# Patient Record
Sex: Female | Born: 1990 | Race: White | Hispanic: No | Marital: Single | Smoking: Former smoker
Health system: Southern US, Community
[De-identification: ages and names within clinical notes are randomized; demographics above are authoritative.]

## PROBLEM LIST (undated history)

## (undated) DIAGNOSIS — R569 Unspecified convulsions: Secondary | ICD-10-CM

## (undated) DIAGNOSIS — M797 Fibromyalgia: Secondary | ICD-10-CM

## (undated) DIAGNOSIS — F329 Major depressive disorder, single episode, unspecified: Secondary | ICD-10-CM

## (undated) DIAGNOSIS — F32A Depression, unspecified: Secondary | ICD-10-CM

## (undated) DIAGNOSIS — G629 Polyneuropathy, unspecified: Secondary | ICD-10-CM

## (undated) DIAGNOSIS — F419 Anxiety disorder, unspecified: Secondary | ICD-10-CM

## (undated) DIAGNOSIS — G8929 Other chronic pain: Secondary | ICD-10-CM

## (undated) DIAGNOSIS — G44321 Chronic post-traumatic headache, intractable: Secondary | ICD-10-CM

## (undated) DIAGNOSIS — F509 Eating disorder, unspecified: Secondary | ICD-10-CM

## (undated) DIAGNOSIS — R630 Anorexia: Secondary | ICD-10-CM

## (undated) DIAGNOSIS — J45909 Unspecified asthma, uncomplicated: Secondary | ICD-10-CM

## (undated) DIAGNOSIS — K9041 Non-celiac gluten sensitivity: Secondary | ICD-10-CM

## (undated) DIAGNOSIS — M1990 Unspecified osteoarthritis, unspecified site: Secondary | ICD-10-CM

## (undated) DIAGNOSIS — F5000 Anorexia nervosa, unspecified: Secondary | ICD-10-CM

## (undated) HISTORY — PX: TYMPANOSTOMY TUBE PLACEMENT: SHX32

## (undated) HISTORY — DX: Chronic post-traumatic headache, intractable: G44.321

## (undated) HISTORY — DX: Unspecified osteoarthritis, unspecified site: M19.90

## (undated) HISTORY — DX: Polyneuropathy, unspecified: G62.9

## (undated) HISTORY — DX: Anorexia nervosa, unspecified: F50.00

## (undated) HISTORY — PX: OTHER SURGICAL HISTORY: SHX169

## (undated) HISTORY — PX: ELECTROCONVULSIVE THERAPY: SHX1495

---
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
## 2010-04-28 LAB — GC/CHLAMYDIA PROBE AMP, URINE: Chlamydia, Swab/Urine, PCR: NEGATIVE

## 2010-04-28 LAB — DRUGS OF ABUSE SCREEN W/O ALC, ROUTINE URINE
Amphetamine Screen, Ur: NEGATIVE
Barbiturate Quant, Ur: NEGATIVE
Benzodiazepines.: NEGATIVE
Cocaine Metabolites: NEGATIVE
Creatinine,U: 160.4 mg/dL
Marijuana Metabolite: NEGATIVE
Methadone: NEGATIVE
Opiate Screen, Urine: NEGATIVE
Phencyclidine (PCP): NEGATIVE
Propoxyphene: NEGATIVE

## 2010-04-28 LAB — HEPATIC FUNCTION PANEL
ALT: 34 U/L (ref 0–35)
AST: 27 U/L (ref 0–37)
Albumin: 4 g/dL (ref 3.5–5.2)
Alkaline Phosphatase: 61 U/L (ref 39–117)
Bilirubin, Direct: <0.1 mg/dL (ref 0.0–0.3)
Total Bilirubin: 0.4 mg/dL (ref 0.3–1.2)
Total Protein: 7.1 g/dL (ref 6.0–8.3)

## 2010-04-28 LAB — URINALYSIS, ROUTINE W REFLEX MICROSCOPIC
Glucose, UA: NEGATIVE mg/dL
Nitrite: NEGATIVE
Nitrite: NEGATIVE
Protein, ur: NEGATIVE mg/dL
Specific Gravity, Urine: 1.025 (ref 1.005–1.030)
Urobilinogen, UA: 0.2 mg/dL (ref 0.0–1.0)
pH: 6 (ref 5.0–8.0)

## 2010-04-28 LAB — CBC
HCT: 37.3 % (ref 36.0–46.0)
HCT: 37.4 % (ref 36.0–46.0)
Hemoglobin: 12.5 g/dL (ref 12.0–15.0)
MCV: 95.1 fL (ref 78.0–100.0)
MCV: 96.6 fL (ref 78.0–100.0)
RBC: 3.86 MIL/uL — ABNORMAL LOW (ref 3.87–5.11)
RBC: 3.93 MIL/uL (ref 3.87–5.11)
WBC: 5.8 10*3/uL (ref 4.0–10.5)
WBC: 9.1 10*3/uL (ref 4.0–10.5)

## 2010-04-28 LAB — COMPREHENSIVE METABOLIC PANEL
Alkaline Phosphatase: 46 U/L (ref 39–117)
BUN: 11 mg/dL (ref 6–23)
CO2: 31 mEq/L (ref 19–32)
Chloride: 107 mEq/L (ref 96–112)
Creatinine, Ser: 0.62 mg/dL (ref 0.4–1.2)
GFR calc non Af Amer: >60 mL/min (ref >60)
Potassium: 3.7 mEq/L (ref 3.5–5.1)
Total Bilirubin: 0.5 mg/dL (ref 0.3–1.2)

## 2010-04-28 LAB — DIFFERENTIAL
Basophils Absolute: 0 10*3/uL (ref 0.0–0.1)
Basophils Absolute: 0.1 10*3/uL (ref 0.0–0.1)
Basophils Relative: 0 % (ref 0–1)
Basophils Relative: 1 % (ref 0–1)
Eosinophils Absolute: 0.2 10*3/uL (ref 0.0–0.7)
Eosinophils Relative: 2 % (ref 0–5)
Eosinophils Relative: 2 % (ref 0–5)
Lymphocytes Relative: 28 % (ref 12–46)
Lymphocytes Relative: 42 % (ref 12–46)
Lymphs Abs: 2.5 10*3/uL (ref 0.7–4.0)
Monocytes Absolute: 0.4 10*3/uL (ref 0.1–1.0)
Monocytes Absolute: 0.7 10*3/uL (ref 0.1–1.0)
Monocytes Relative: 8 % (ref 3–12)
Neutro Abs: 2.8 10*3/uL (ref 1.7–7.7)
Neutro Abs: 5.6 10*3/uL (ref 1.7–7.7)
Neutrophils Relative %: 62 % (ref 43–77)

## 2010-04-28 LAB — GAMMA GT: GGT: 26 U/L (ref 7–51)

## 2010-04-28 LAB — BASIC METABOLIC PANEL
BUN: 10 mg/dL (ref 6–23)
CO2: 27 mEq/L (ref 19–32)
Calcium: 9.3 mg/dL (ref 8.4–10.5)
Chloride: 101 mEq/L (ref 96–112)
Creatinine, Ser: 0.66 mg/dL (ref 0.4–1.2)
GFR calc Af Amer: >60 mL/min (ref >60)
GFR calc non Af Amer: >60 mL/min (ref >60)
Glucose, Bld: 95 mg/dL (ref 70–99)
Potassium: 4.2 mEq/L (ref 3.5–5.1)
Sodium: 140 mEq/L (ref 135–145)

## 2010-04-28 LAB — RPR: RPR Ser Ql: NONREACTIVE

## 2010-04-28 LAB — ETHANOL: Alcohol, Ethyl (B): <5 mg/dL (ref 0–10)

## 2010-04-28 LAB — TSH: TSH: 3.532 u[IU]/mL (ref 0.350–4.500)

## 2010-04-28 LAB — PREGNANCY, URINE: Preg Test, Ur: NEGATIVE

## 2010-04-28 LAB — RAPID URINE DRUG SCREEN, HOSP PERFORMED: Barbiturates: NOT DETECTED

## 2010-04-28 LAB — POCT PREGNANCY, URINE: Preg Test, Ur: NEGATIVE

## 2010-05-02 LAB — CBC
MCHC: 34.5 g/dL (ref 31.0–37.0)
MCV: 93.9 fL (ref 78.0–98.0)
MCV: 93.9 fL (ref 78.0–98.0)
Platelets: 200 10*3/uL (ref 150–400)
RBC: 3.7 MIL/uL — ABNORMAL LOW (ref 3.80–5.70)
RBC: 3.8 MIL/uL (ref 3.80–5.70)
WBC: 5.3 10*3/uL (ref 4.5–13.5)
WBC: 5.6 10*3/uL (ref 4.5–13.5)

## 2010-05-02 LAB — RAPID URINE DRUG SCREEN, HOSP PERFORMED
Barbiturates: NOT DETECTED
Cocaine: NOT DETECTED
Opiates: NOT DETECTED

## 2010-05-02 LAB — DIFFERENTIAL
Basophils Absolute: 0 10*3/uL (ref 0.0–0.1)
Basophils Relative: 1 % (ref 0–1)
Basophils Relative: 1 % (ref 0–1)
Eosinophils Absolute: 0.1 10*3/uL (ref 0.0–1.2)
Eosinophils Absolute: 0.1 10*3/uL (ref 0.0–1.2)
Eosinophils Relative: 2 % (ref 0–5)
Lymphocytes Relative: 47 % (ref 24–48)
Monocytes Relative: 9 % (ref 3–11)
Neutro Abs: 2.4 10*3/uL (ref 1.7–8.0)
Neutrophils Relative %: 44 % (ref 43–71)

## 2010-05-02 LAB — COMPREHENSIVE METABOLIC PANEL
ALT: 50 U/L — ABNORMAL HIGH (ref 0–35)
AST: 66 U/L — ABNORMAL HIGH (ref 0–37)
Alkaline Phosphatase: 43 U/L — ABNORMAL LOW (ref 47–119)
CO2: 29 mEq/L (ref 19–32)
Chloride: 105 mEq/L (ref 96–112)
Creatinine, Ser: 0.62 mg/dL (ref 0.4–1.2)
Total Bilirubin: 0.5 mg/dL (ref 0.3–1.2)

## 2010-05-02 LAB — HEPATIC FUNCTION PANEL
ALT: 59 U/L — ABNORMAL HIGH (ref 0–35)
AST: 20 U/L (ref 0–37)
Albumin: 3.6 g/dL (ref 3.5–5.2)
Alkaline Phosphatase: 38 U/L — ABNORMAL LOW (ref 39–117)
Bilirubin, Direct: 0.1 mg/dL (ref 0.0–0.3)
Indirect Bilirubin: 0.4 mg/dL (ref 0.3–0.9)
Total Bilirubin: 0.5 mg/dL (ref 0.3–1.2)

## 2010-05-02 LAB — HCG, SERUM, QUALITATIVE: Preg, Serum: NEGATIVE

## 2010-05-02 LAB — URINALYSIS, DIPSTICK ONLY
Bilirubin Urine: NEGATIVE
Hgb urine dipstick: NEGATIVE
Ketones, ur: NEGATIVE mg/dL
Nitrite: NEGATIVE
Urobilinogen, UA: 1 mg/dL (ref 0.0–1.0)

## 2010-05-02 LAB — DRUGS OF ABUSE SCREEN W/O ALC, ROUTINE URINE
Amphetamine Screen, Ur: NEGATIVE
Barbiturate Quant, Ur: NEGATIVE
Cocaine Metabolites: NEGATIVE
Methadone: NEGATIVE
Phencyclidine (PCP): NEGATIVE

## 2010-05-02 LAB — ACETAMINOPHEN LEVEL: Acetaminophen (Tylenol), Serum: <10 ug/mL — ABNORMAL LOW (ref 10–30)

## 2010-05-02 LAB — PHOSPHORUS: Phosphorus: 3.3 mg/dL (ref 2.3–4.6)

## 2010-05-02 LAB — LIPID PANEL
Cholesterol: 195 mg/dL — ABNORMAL HIGH (ref 0–169)
Total CHOL/HDL Ratio: 3 RATIO

## 2010-05-02 LAB — POCT I-STAT, CHEM 8
BUN: 12 mg/dL (ref 6–23)
Calcium, Ion: 1.15 mmol/L (ref 1.12–1.32)
Chloride: 106 mEq/L (ref 96–112)
Creatinine, Ser: 0.8 mg/dL (ref 0.4–1.2)
Glucose, Bld: 80 mg/dL (ref 70–99)
HCT: 35 % — ABNORMAL LOW (ref 36.0–49.0)
Potassium: 3.8 mEq/L (ref 3.5–5.1)

## 2010-05-02 LAB — MAGNESIUM: Magnesium: 1.8 mg/dL (ref 1.5–2.5)

## 2010-05-02 LAB — BENZODIAZEPINE, QUANTITATIVE, URINE: Alprazolam (GC/LC/MS), ur confirm: NEGATIVE

## 2010-06-04 NOTE — H&P (Signed)
NAME:  Terry Schneider, NATIONS NO.:  [id number]   MEDICAL RECORD NO.:  [id number]          PATIENT TYPE:  INP   LOCATION:  0604                          FACILITY:  [facility]   PHYSICIAN:  Lalla Brothers, MDDATE OF BIRTH:  01-21-1990   DATE OF ADMISSION:  04/10/2008  DATE OF DISCHARGE:                       PSYCHIATRIC ADMISSION ASSESSMENT   IDENTIFICATION:  A 20-year-5-month-old female is admitted emergently  voluntarily upon transfer from [facility] Emergency Department  for inpatient stabilization and treatment of suicide risk, voices which  mother denies telling the patient to eat because she is going to die  anyway, frustration and regressive fixation, and loss of control of  eating disorder symptoms of 6 months.  The patient fears but expects to  be held down and forced and she extorts avoidance of more responsible  treatment for her problems by her retaliatory voices and threats.  She  reports 2 previous suicide gestures and now 6 weeks of suicidal ideation  escalated over the last 4 to 5 days to a plan to jump into traffic or  overdose.  The emergency department could not secure eating disorder  treatment primarily with all concluding emphasis to be stabilization of  depressive symptoms and suicide risk first.  As the patient was entering  the acute inpatient adolescent unit, mother was expecting to bring the  patient food in place of hospital food, with the patient having last  eaten a kid's [place] meal at [employer] the night before and sometimes going  a couple of days without eating.   HISTORY OF PRESENT ILLNESS:  The patient is currently morbidly fixated  and frustration and despair of her eating disorder symptoms she  concludes to be incurable.  The patient tends to convince other family  members of the same.  The patient is in a developmental vulnerable  position being the youngest child in the family after alcoholic father  divorced 2 years ago.   Father rarely sees the patient despite living 5  minutes away.  Mother expects that the patient is sophisticated and high  achieving so as the mother does not agree the patient could be regressed  with baby talk and swinging for an hour a day as one of her principal  activities.  The patient is declining in her school performance barely  passing math and other grades down from A's and B's.  The patient is  taking Prozac 20 mg daily though mother reports they are tapering the  medication for discontinuation as it is not helping.  The patient has  not benefited from a previous trial of Lexapro prior to Prozac.  The  patient does receive Xanax 0.5 mg which apparently the mother governs.  She had Valium as 2 doses of 10 mg each orally in the emergency  department for her agitation.  She was considered by ACT team counselor  to have auditory hallucinations and psychosis though apparently mother  and emergency department staff did not agree.  The patient has also  taken some Zofran as needed apparently for nausea, purging.  Electrolytes were normal in the emergency department.  Total protein was  5.9 with lower limit of normal 6.  Hematocrit was 34.7 with lower limit  of normal 36.  The patient reported a 20- to 30-pound weight loss since  November 2009 with a 30-pound weight loss calculated for changes in  current body weight to represent a 23% loss of body weight over 3 to 4  months with ideal body weight to be addressed.  The patient reports  amenorrhea since approximately November 2009 approximately 4 months.  She has body image distortion.  She purges several times daily.  She  denies any sexual activity.  Her urine drug screen was positive for  benzodiazepines, apparently the Valium or Xanax.   REVIEW OF SYSTEMS:  The patient has been under the psychotherapeutic  care of Shanon Rosser at [phone].  She has had no known seizure or  syncope.  She has had no heart murmur or arrhythmia.  The  patient denies  difficulty with gait, gaze or continence.  She denies exposure to  communicable disease or toxins.  She denies rash, jaundice or purpura.  There is no chest pain, palpitations or presyncope.  There is no  abdominal pain except for a feeling of fullness, diarrhea, dysuria or  arthralgia.  There is no headache or memory loss.  There is no sensory  loss or coordination deficit.   Immunizations are up to date.   FAMILY HISTORY:  The patient resides with mother and 20 year old sister  who is away at college.  Parents separated 3 years ago and divorced 2  years ago with the patient last seeing father January 11, 2008.  She  has little contact with father despite him being 5 minutes away.  The  patient perceives that father prefers the children of his girlfriend.  Father and paternal grandfather had substance abuse with alcohol.  Paternal grandmother had depression.  Maternal great-aunt had bipolar  disorder treated with lithium.  Maternal great-grandmother required ECT  and a year-long psychiatric institutionalization.  Father was  emotionally abusive from drinking in the past.  Paternal uncle had a  heart attack and died in Jun 22, 2005.  Another paternal uncle had suicide  attempt and overdose in his 20s.   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  She has A and B  grades dropping over the last month including math barely passing.  She  works at [employer] the last year and associates often with  management.  The patient indicates she is accepted at [employer] in [place], [address] or [employer].  However, she has not decided on school.  It appears that current illness  will interfere with even high school completion.   ASSETS:  The patient is intelligent.   MENTAL STATUS EXAM:  Height is 161.5 cm and weight is 49.5 kg.  Blood  pressure is 117/80  with heart rate of 80 sitting and 115/78 with heart  rate of 91 standing.  Temperature is 98.2.  She is right handed.  She is  alert and oriented with cranial nerves intact though speech is somewhat  regressive and tongue thrustingly slow and imprecise.  She has normal  muscle strength and tone.  There are no pathologic reflexes or soft  neurologic findings.  There are no abnormal involuntary movements.  Gait  and gaze are intact.  The patient has regressive childlike voice and  manners.  Her symptoms exacerbate as others attempt to  coerce her.  She  incorporates defeat for each element of treatment to prove that she is  incurable.  She is working the last year at [employer].  She presents  severe dysphoria by history though she is progressively laughing on  arrival.  She is not anxious but is rigid despite appearing  disinhibited.  She has had death and failure fixations.  She reports  voices telling her to go ahead and eat because she is dying anyway.  She  has no homicidal ideation.  She reports 2 previous suicide gestures and  now a plan to jump in traffic or overdose.   IMPRESSION:  Axis I:  1. Major depression, single episode, severe.  2. Anorexia nervosa, purging type.  3. Anxiety disorder, not otherwise specified (provisional diagnosis).  4. Parent child problem.  5. Other specified family circumstances.  6. Other interpersonal problem.  Axis II:  Diagnosis deferred.  Axis III:  Reported weight loss of 20 to 30 pounds in 4 months.  Axis IV:  Stressors, family, severe acute and chronic; phase of life,  extreme acute and chronic; school, mild acute and acute and chronic;  occupational, moderate acute and chronic.  Axis V:  Global Assessment Functioning on admission 20 with highest in  last year 78.   PLAN:  The patient is admitted for inpatient adolescent psychiatric and  multidisciplinary multimodal behavioral treatment in a team-based  programmatic locked psychiatric unit.   Will start Remeron  pharmacotherapy at 15 mg nightly and can discontinue Prozac.  Nutrition  consultation is planned along with multivitamin.  Cognitive behavioral  therapy, anger management, interpersonal therapy, habit reversal, family  therapy, individuation separation, social and communication skill  training and problem-solving and coping skill training can be undertaken  as stabilization is accomplished of depression and suicidality in order  to secure transfer to the [facility] for the patient's eating  disorder treatment.  Mother is educated on the medications including FDA  warnings and side effects.  Estimated length of stay is 5 to 7 days with target symptoms for  discharge being stabilization of suicide risk and mood, stabilization of  dangerous disruptive behavior and generalization of the capacity for  safe and effective participation in next level of care.      Lalla Brothers, MD  Electronically Signed     GEJ/MEDQ  D:  04/11/2008  T:  04/11/2008  Job:  [id number]

## 2010-06-04 NOTE — Discharge Summary (Signed)
NAME:  Terry Schneider, Terry Schneider NO.:  [id number]   MEDICAL RECORD NO.:  [id number]          PATIENT TYPE:  INP   LOCATION:  0604                          FACILITY:  [facility]   PHYSICIAN:  Lalla Brothers, MDDATE OF BIRTH:  [birth year]   DATE OF ADMISSION:  04/10/2008  DATE OF DISCHARGE:  04/14/2008                               DISCHARGE SUMMARY   IDENTIFICATION:  Immediately 20 year old female, 12th grade student at  [employer] was admitted emergently, voluntarily upon transfer  from [facility] Emergency Department for inpatient psychiatric  treatment of suicide risk, auditory illusions or hallucinations,  regressive interpersonal fixation extending to activities, and loss of  control of eating disorder symptoms of 6 months.  Patient has ruminative  and obsessive conflicts about the origin and management of anorexic  symptoms with purging.  Mother appears driven that the patient will  succeed and graduate from high school and attend college while the  patient almost seems to identify with father in disengaging from  responsibilities.  The patient has a suicide plan to jump into traffic  or overdose, reporting 2 previous gestures.  Mother seeks stabilization  of the patient's mood and dangerous behavior, anticipating that the  patient will enter eating disorder center in [place] next week once  she is more psychologically stable from her current threats and  overwhelming psychological and behavioral symptoms outside of eating  disorder.  For full details please see the typed admission assessment.   SYNOPSIS OF PRESENT ILLNESS:  The patient is the youngest of 2 children,  having a 68 year old sister away at college.  Biological parents  divorced 2 years ago after 3 years of separation.  The patient last saw  father January 11, 2008, though father lives 5 minutes away.  The  patient will not talk to older sister about eating problems.  The father  has  substance abuse with alcohol and has been emotionally maltreating  the family.  Paternal uncle died of myocardial infarction in 2007.  Father speaks mainly to others about his girlfriend's children.  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  A's and B's are now dropping, especially math in the last  month.  Her cholesterol as been elevated in the past.  Paternal uncle  attempted suicide in his 36s with cleaning fluid.  Maternal great-aunt  was treated with lithium for bipolar disorder.  Paternal grandmother had  depression.  Maternal great-grandmother was institutionalized for 1 year  having ECT.  Father and paternal grandfather have substance abuse with  alcohol.  The patient has been working effectively with her  psychotherapist, Romeo Apple at [phone].  The patient has been  treated with Lexapro and then Prozac from Dr. Jennette Kettle, with Prozac now  being tapered because it has been unsuccessful.  She had Xanax 0.5 mg  which mother administers p.r.n. for the patient, though the emergency  department give the patient Valium 20 mg for agitation.  The patient has  had 4 months of  amenorrhea with last menses in November 2009.  She  reports approximately a 20-30 pound weight loss or more since November  2009 which would be a maximum of 23% of total body weight being lost.  The patient has body image distortion.  She is now purging after meals  and has tremendous psychic turmoil and conflict over whether or not to  eat.  She reports that she hears a derogatory voice telling her to go  ahead and eat because she is going to die anyway.  She becomes suicidal  each time she eats, last eating a kids [place] meal at [employer] the night  before admission.  She swings at least an hour a day on her swing set at  home, easily leaning backward in the seat if she is angry and leaning  forward and she is  regressed.   INITIAL MENTAL STATUS EXAM:  The patient is right handed with intact  neurological exam.  She has a regressive childlike voice on arrival with  similar mannerisms, though she did receive 20 mg of Valium in the  emergency department.  She talks with a tongue thrust in a slow  imprecise fashion.  She incorporates defeat cognitively for each  mechanism of treatment here or elsewhere so that she is now fixated and  severe dysphoria and treatment failure.  She thereby has 2 suicide  gestures in the past with a current plan to jump in traffic or overdose.  She is not floridly psychotic or dissociative, though she is  significantly regressed and depressed.   LABORATORY FINDINGS:  In the emergency department, potassium was 3.8,  dropping to 3.4 with hydration and initial efforts at refeeding  including accomplishing oral hydration.  Her total CO2 was 31 when  potassium was 3.8.  CO2 of 29, potassium 3.4.  Hemoglobin was 11.9 in  the emergency department, repeated at 12:00 with red count slightly low  at 3.7 million with lower limit of normal 3.8 million.  CBC otherwise  normal.  Repeat CBC on her Remeron 3 days later noted hemoglobin normal  at 12 with hematocrit slightly low at 35.7 with lower limit of normal  36.  White count was normal at 5300, down from 5600 3days earlier in the  emergency department.  MCV was 93.9, platelet count 254,000, both  normal.  Blood alcohol and urine drug screens were negative except  positive for benzodiazepines, likely the Xanax at home or Valium in the  emergency department.  Urine pregnancy and serum pregnancy tests were  negative.  Acetaminophen and salicylate blood levels were negative.  In  the emergency department, AST was 20 with reference range 0-37, becoming  66 3 days later at the [facility] while on Remeron 30 mg  nightly and undergoing some degree of brief eating.  On the day of  discharge, hepatic function panel noted AST  down to 64 from 66 with  reference range 0-37.  Initial ALT was 15 with normal 0-35 in the  emergency department, becoming 50 3 days later at the [facility] on Remeron and with refeeding efforts and the following day on  the day of discharge was 59 with reference range 0-35.  Serial albumins  were 3.6, 3.8, and 3.6 respectively with normal being 3.5-5.2.  Total  protein varied from 5.9 in the emergency department to 6.1 3 days later  at the [facility] and then 5.9 the following day on the  day of discharge with reference  range 3.5-5.2.  GGT was normal at 20  with reference range 7-51.  Ten-hour fasting lipid profile revealed  total cholesterol slightly elevated at 195 with reference range 0-169  with LDL 115 with upper limit of normal 109 mg/dL.  HDL cholesterol was  normal at 65, VLDL 15, and triglycerides 77 mg/dL.  TSH was normal at  1.884 with reference range 0.35-4.5.  Phosphorus was normal at 3.3 with  reference range 2.3-4.6.  Magnesium was 1.8 with reference range 1.5-  2.5.  Urinalysis was normal with specific gravity of 1.010 and pH 6.5.  Electrocardiogram was sinus bradycardia with rate of 59 with some sinus  arrhythmia, otherwise normal EKG with QRS of 86, QTC of 427 milliseconds  and PR of 136 milliseconds.  There were no contraindications to  definitive treatment with Remeron or for eating disorder.   HOSPITAL COURSE AND TREATMENT:  General medical exam by Jorje Guild, [facility]  noted mild or borderline anemia findings, likely that of chronic disease  associated with her weight loss over 6 months.  Although further workup  of anemia certainly possible with the patient's emaciated state, it  appears that treatment of the anorexia is foremost significance.  Exam  was otherwise generally unremarkable except for her undernourished,  somewhat emaciated state with amenorrhea since November 2009 and likely  a 23% weight loss.  The patient was seen by  nutrition, Kendell Bane,  RD, LDN, though the patient was not appreciative of the help provided.  Still she did begin to respond more favorably in her behavioral therapy  after the consultation April 11, 2008.  Estimated caloric needs were 40  kilocalories per kilogram and protein of 0.8 g per kilogram.  Her BMI  was 18.7 with her weight loss estimated over 4 months at 22%.  The  patient was offered resource in addition to her to chewable  multivitamin, multimineral, and a regular diet.  Mother was not allowed  to bring food to the hospital unit.  The patient was started on Remeron  15 mg the first night, titrated up to 30 mg the second night, as she had  not been sleeping well and had depression and anxiety contributing to  her desperate sense that she was hearing a voice saying that she would  be held down and force fed.  The patient reported increased suicidal  ideation for the first few days as she did begin to eat more.  She ate a  normal size meal prior to discharge.  She had a 30-minute watch after  meals.  Her vital signs improved through hospital stay.  Her height was  161.5 cm.  Her admission weight was 49.5 kg, dropping to 49.25 kg, but  on the day of discharge was 52 kg.  Temperature was 98.2 on admission,  dropping to a low of 97.3, and was 97.7 on the day of discharge.  Initial supine blood pressure was 105/72 with heart rate of 49 and  standing blood pressure 121/84 with heart rate of 67.  On the day of  discharge, supine blood pressure was 107/75 with heart rate of 83 and  standing blood pressure 146/73 with heart rate of 109.  Father wanted  the patient's medical analysis on the unit when the patient was asking  not to see father on the unit as she was not psychologically able to  address the family problems including their absence of contact for at  least 3 months.  Father's call was returned at [phone],  Haliyah Fryman just after the patient's discharge when  medical testing results  were available.  A message was left as their was no answer about the  availability of results now.  Mother can be reached at [phone]. Amy  Mccurley.  Mother initially demanded expedited discharge so the patient  could be at home with pets, swinging, and preparing for next  responsibilities, seemingly meaning [facility] in [place].  Mother  gradually relaxed and allowed the patient to complete initial acute  inpatient treatment for depression.  Mother and patient improved  communication.  The patient received an influenza vaccine prior to  discharge and was celebrating her birthday on the day of discharge  including with peers.  Where she initially laid in bed and declined to  participate in the program, she by the time of discharge was active in  all parts of treatment though she was somewhat limited in recreation  therapy though she was being allowed to attend.  Her mood, physical  stamina and endurance, and treatment participation did improve.  By the  time of discharge she was having no hallucinations or illusions of an  auditory nature.  Her suicidal ideation remitted including in the final  family therapy session.  Initially mother expected a 5-day hiatus before  she could enter an eating disorder program but she found that that had  been shortened to a 4-day hiatus after they received all of the faxed  data on the patient.  The patient's slight elevation in liver function  test was felt to be associated with both the introduction of Remeron and  the refeeding.  The mild elevation appears to have peaked by the time of  discharge with AST starting back down.  The patient is not considered to  have any medical impairment other than that of her chronic malnutrition.  Mother and the patient processed that the patient's worst time for  feeling suicidal was from 3:00 a.m. to 4:00 a.m., and although the  patient is free of suicidal ideation at the time of  discharge, mother  plans to sleep in the patient's room over the first several days.  The  patient is hydrating much better by the time of discharge and was  feeding 3 meals a day as well.  She required no seclusion or restraint  during hospital stay.   FINAL DIAGNOSES:  AXIS I:  1. Major depression, single episode, severe  2. Anorexia nervosa, purging type.  3. Parent child problem.  4. Other specified family circumstances.  5. Other interpersonal problems.  AXIS II:  Diagnosis deferred.  AXIS III:  1. Reported weight loss of 22% in 4 months with amenorrhea.  2. Minimal hypokalemia.  3. Minimal elevation of liver function tests likely from refeeding and      Remeron, beginning to resolve by the time of discharge.  4. Borderline nutritional anemia and low total protein.  AXIS IV:  Stressors:  Family severe acute and chronic; phase of life  extreme acute and chronic; school mild acute and chronic; occupational  moderate acute and chronic.  AXIS V: GAF on admission 20 with highest in last year 78 and discharge  GAF was 43.   PLAN:  The patient and mother did complete the inpatient treatment as  per goals and plans.  The patient is discharged on the weight  maintenance diet as per nutritionist April 11, 2008.  She will increase  activity slowly.  She has no wound care or pain management needs.  Crisis and safety plans are outlined if needed.   DISCHARGE MEDICATIONS:  She is discharged on the following medication.  1. Influenza vaccine was given April 14, 2008 intramuscular.  2. Mirtazapine 30 mg tablet every bedtime, quantity #30 prescribed      with no refill.  3. K-Dur 20 mEq daily for 5 days.  4. Multivitamin, multimineral daily over-the-counter.   The patient will enter the [facility] in [place] [phone],  extension [phone] on April 17, 2008, according to their correspondence with  mother.      Lalla Brothers, MD  Electronically Signed     GEJ/MEDQ   D:  04/14/2008  T:  04/14/2008  Job:  [id number] [id number]   cc:   [facility]  [address]  [place], [place] [address]  Valinda Hoar [phone]

## 2010-06-04 NOTE — H&P (Signed)
NAME:  Terry Schneider, Terry Schneider NO.:  [id number]   MEDICAL RECORD NO.:  [id number]          PATIENT TYPE:  INP   LOCATION:  0105                          FACILITY:  [facility]   PHYSICIAN:  Lalla Brothers, MDDATE OF BIRTH:  08-22-1990   DATE OF ADMISSION:  08/01/2008  DATE OF DISCHARGE:  08/02/2008                       PSYCHIATRIC ADMISSION ASSESSMENT   IDENTIFICATION:  An 20 year old female who has graduated twelfth grade  at [employer], planning [place], September 09, 2008, is  admitted emergently voluntarily as brought by a friend, later  accompanied by mother, to [facility] access and intake  crisis, requiring inpatient stabilization and treatment of suicide risk,  command auditory hallucinations to kill herself, depression, and  intolerance of professional confrontation of her fixation and persisting  symptoms in the course of outpatient treatment.  The patient indicates  she will have to get herself together if she is going to attend college,  and she acknowledges she would prefer to return to the [facility] in [place], where she completed 45 days in eating  disorder treatment in May 2010, concluding that she grew up during this  time.  Mother states the [facility] would not be appropriate for the  voices and depression, though the patient did see a psychiatrist there  weekly about her medications.  Mother indicates that the patient can  make herself appear happy, even when mother only values the patient's  verbal endorsement of how the patient feels inside.  The patient is  admitted for medication adjustment as required in the course of her  outpatient eating disorder psychotherapy with Romeo Apple with the  patient and mother indicating they will not return to the current  pharmacotherapy with Ms. Merilynn Finland, N.P., having already scheduled an  outpatient appointment with Dr. Milagros Evener August 14, 2008.   Mother  knows that the patient is seriously suicidal, as she was in the  emergency department July 23, 2008, though mother states that there were  no available resources for hospitalization at that time, while the  emergency department record indicates that the patient's hallucinations  and suicidal ideation remitted during her emergency department stay and,  therefore, she was to see her outpatient providers rather than being  admitted.   HISTORY OF PRESENT ILLNESS:  The patient was admitted from [facility]  Emergency Department to the adolescent psychiatric unit of the  [facility] March 22 through April 14, 2008, at which time  the patient was experiencing auditory hallucinations and depressed mood  with the voices telling the patient to eat because she was going to die  anyway.  She had 6 months of progressive anorexia nervosa at that time  and feared that she would be held down and force-fed.  There was a  retaliatory quality to the voices and threats at that time, reporting  two previous suicide gestures prior to that admission, including an  aspirin overdose.  She had a suicide plan at that time to jump into  traffic or overdose.  Mother and patient were ambivalent about  hospitalization at that time, but did complete the  course of treatment,  not allowing biological father onto the unit to meet with the patient.  The patient is now talking again to father but seems ambivalent about  school and responsibilities.   Mother believes the patient is seriously motivated to attend  [employer],  stating that the patient will have to go to [employer] or have some  educational structure if she does not go to [employer], but stays in  mother's home.  The patient presents more ambivalence about school in  the future.  She indicates that she does talk to older sister about her  eating disorder now and older sister is a Archivist, at home for  the summer.  The patient did make progress at  [facility], but has  apparently regressed some in her current outpatient treatment, which the  family has attributed to the patient's sleep being impaired until  Seroquel accomplished sleep at 400 mg nightly, and now depression and  hallucinations interfering.  The chaotic pattern of symptoms undermining  treatment course was evident during the patient's last hospitalization,  as well.  The patient is highly intelligent.  She does confirm that her  treatment at [facility] helped her grow up but she indicates she has not  completed that process.   The patient is the youngest of two children, born to biological parents,  with father having substance abuse with alcohol and being somewhat  emotionally abusive to the family.  Father is similar to paternal  grandfather, who had substance abuse with alcohol as well.  The patient  seems to fuse with mother in her interpersonal style with both smiling  and waving to staff on arrival to the hospital unit.  Mother emphasized  the importance of the patient's family pets during the last  hospitalization, wanting the patient discharged early to spend time with  the family and pets before proceeding to [place].  The patient is now  sleeping 5 hours nightly despite Remeron and Seroquel.  The patient is  not eating as well as she had after discharge from her eating disorder  treatment in May 2010.   She had worked with the [facility], receiving Lexapro and then Prozac  20 mg, along with Xanax as needed, prior to her last hospitalization  here in March 2010.  She was working with Romeo Apple, [phone], in  therapy prior to her last hospitalization here and continues  psychotherapy.  Since release from [facility], the patient has been  receiving psychiatric treatment with Dr. Emerson Monte and Ms.  Merilynn Finland, NP in that office until apparently the patient considered  that she was being blamed for being depressed and having voices.  This  pattern seems  similar to the pattern established with father in the  past, though the patient states she is getting along better with father  now.  The patient can allow formulation of all of these dynamics for  hopefully moving ahead in her treatment without making her future  decisions based on mental health symptoms.  The patient addresses past  patterns of recovery, such as in the emergency department, July 23, 2008  and during her last hospitalization, to hopefully formulate motivation  and capacity to succeed in treatment currently.   Mother and the patient review medication extensively for establishing  hope and motivation for medication success.  The patient states that she  was told at [facility] that Risperdal would be a good medication for her,  but would make her gain weight.  Therefore, she received  Abilify at  [facility], up to 7.5 mg daily, usually taken in the morning.  Mother  states the patient is currently taking all of her medication at night  and wonders if that is the reason she does not have symptom containment  during the day.  Though we can review the medication half lives and  dosing options, we must clarify that it is not possible to foresee and  work through every obstacle to medication success that might occur.  Still, it is possible to establish with mother and patient some  positivity about the medication changes that Romeo Apple requests,  with Abilify and Prozac being preferred, as stated to mother and the  patient.  The patient's urine drug screen was negative in the emergency  department, July 23, 2008, and during her last hospital stay.  The  patient is not using alcohol or illicit drugs.  The patient is not  sexually active.  Her EKG was normal during her last hospitalization in  March 2010 with QTC 427 milliseconds, having some sinus bradycardia and  sinus arrhythmia.  The patient's lipid profile at that time noted total  cholesterol slightly elevated at 195 with LDL  115, slightly elevated,  while HDL was excellent at 65 and triglycerides 77.   PAST MEDICAL HISTORY:  Patient had menarche at age 60.  Her last menses  was listed at July 09, 2008.  When in the emergency department, July 23, 2008, though she reports today that her last menses was June 29, 2008.  Her menses are irregular, possibly associated with her eating disorder.  She has gained weight effectively since her last hospitalization from  49.5-58 kg.  She had an elevated AST during her last hospitalization  that seems to have been a physiologic response, possibly to re-feeding,  and is now resolved.  She had borderline anemia during her last  hospitalization and in the emergency department, July 23, 2008 with red  cell count 3.86 million, though the remainder of her CBC was within  normal limits at that time.  The patient had ventilation tubes for  recurrent otitis media in the past.  She has a swollen right lower lip  from dental appointment the preceding day.  She has no medication  allergies.   1. At the time of admission, she is taking or Remeron 45 mg nightly.  2. Seroquel 400 mg nightly.  3. Klonopin 0.5 mg t.i.d. or Ativan 0.5 mg t.i.d. p.r.n. which patient      reports using to decrease anxiety associated with meals.   She has had Lexapro, Prozac up to 20 mg daily, Abilify up to 7.5 mg  daily, and Xanax.  She has had no known seizure or syncope.  She has had  no heart murmur or arrhythmia.  She does have a history of purging with  her anorexia nervosa.   REVIEW OF SYSTEMS:  The patient denies difficulty with gait, gaze or  continence.  She denies exposure to communicable disease or toxins.  She  denies rash, jaundice or purpura.  There is no headache, memory loss,  sensory loss or coordination deficit, though she does not recall her  outpatient therapist recommending Prozac in combination with Abilify,  but only the Prozac.  Mother, however, recalls that both were  recommended.   She has no cough, congestion, dyspnea or wheeze.  She has  no chest pain, palpitations or presyncope.  She denies abdominal pain,  nausea, vomiting or diarrhea.  There is no  dysuria or arthralgia.   Immunizations are up-to-date.   FAMILY HISTORY:  Patient resides with mother and 43 year old sister is  home from college for the summer.  Biological parents separated for 3  years and now have been divorced for 2 years with father having  substance abuse with alcohol, like his father, and being emotionally  abusive.  Father has had a girlfriend, paying more attention to the  girlfriend's children than to his own by history in the past.  The  patient states she is now talking and relating well with father who  lives 5 minutes away, as of the time of the last hospitalization.  Maternal great-grandmother was institutionalized for a year for  depression, requiring ECT.  Maternal great-aunt has bipolar disorder,  treated with lithium.  Paternal grandmother had depression.   SOCIAL DEVELOPMENTAL HISTORY:  The patient has graduated twelfth grade  at [employer], after being released from [place] in [place].  She is  apparently accepted to start college at [employer] on September 09, 2008, if  she can get herself together, according to the patient.  She was in  Field seismologist in high school.  She worked at [employer] prior to her last  hospitalization.  Grades are generally A's and B's though math dropped  some during her progressive anorexia nervosa.  She is not sexually  active.  She has no substance abuse.  She has no legal difficulties.   ASSETS:  The patient is intelligent.   MENTAL STATUS EXAM:  Height is 162 cm.  Weight is 58 kg, up from 49.5 kg  in March 2010.  Blood pressure is 124/81 with heart rate of 122 sitting  and 123/76 with heart rate of 123 standing.  She is right-handed.  She  is alert and oriented with speech intact.  Cranial nerves II-XII are  intact.  Muscle strength and tone are  normal.  There are no pathologic  reflexes or soft neurologic findings.  There are no abnormal involuntary  movements.  Gait and gaze are intact.  The patient allows clarification  of projections and projective identifications relative to treatment  providers, family and relatives, and herself.  She has modest anxiety  but moderate to severe dysphoria.  She reports command auditory  hallucinations to harm or kill herself.  She reports suicide plan to  overdose, hang, or jump from a height.  She denies homicide ideation.  She is regressed and repressed, suggesting she should be at [place]  instead of [address], [address].  Mother formulates that Armida Sans is not likely to be  helpful for the current symptoms of the patient and the patient will  need to start organizing her strengths and commitments around  constructive strengths, as can be clarified with mother and her  outpatient therapist, rather than around the command auditory  hallucinations to kill herself, which are different this admission than  last as to exact content, but following a similar pattern of self-  defeat, frustrating to the patient in her relationship with her  outpatient medication management, as had occurred with father during the  last hospitalization here, even though father was not allowed to see the  patient at that time as the patient did not feel she could tolerate it.   IMPRESSION:  AXIS I:  1. Major depression, recurrent, moderate to severe, with early      psychotic features.  2. Anorexia nervosa, purging type.  3. Rule out anxiety disorder, not otherwise specified, with repression      and  regression (provisional diagnosis).  4. Other interpersonal problem.  5. Parent/child problem.  6. Other specified family circumstances.  AXIS II:  Diagnosis deferred.  AXIS III:  1. Borderline anemia.  2. Irregular menses.  3. Edematous right lower lip, following dental appointment the day      before admission.  AXIS IV:   Stressors:  Family severe, acute and chronic; phase of life  extreme, acute and chronic; school moderate, acute and chronic.  AXIS V:  GAF on admission 20 to 30 with highest in last year estimated  78.   PLAN:  The patient is admitted for inpatient adolescent psychiatric and  multidisciplinary multimodal behavioral treatment in a team-based, programmatic, locked psychiatric unit.  Prozac is ordered at 20 mg every  morning to titrate up quickly to 40 mg every morning, if tolerated well,  combined with the Remeron 45 mg every bedtime.  We will start Abilify 10  mg every morning and discontinue Seroquel.  The patient is not sleeping  more than 5 hours nightly currently, despite Seroquel 400 mg and Remeron  45 mg.  Mother states sleep problems have been significant in the past  but that Seroquel seemed to help initially.  We will monitor closely for  any hyper-serotonergic side effects or any Seroquel discontinuation  symptoms.  We will discontinue benzodiazepines during the day and  reserve Klonopin to 1 mg every bedtime if needed for sleep and can  advance to 2 mg, if tolerated.  Mother and patient report hope that the  combination of Prozac and Abilify will work for voices and depression,  as well as contribute to eating disorder stabilization, according to  outpatient care recently.  Remeron could be tapered and discontinued if  she sleeps well with stabilization of depression and any anxiety.  Mother makes [place] aware that the patient can make herself appear happier  otherwise, even if she is having other symptoms inside.  Efforts to work  through to achieve genuine communication and collaboration in treatment  are planned.  Cognitive behavioral therapy, anger management, behavioral  nutrition, interpersonal therapy, family therapy, individuation  separation, desensitization, reintegration, social and communication  skill training, problem-solving and coping skill training, and identity   consolidation therapies can be undertaken.   Estimated length stay is 5-7 days with target symptoms for discharge  being stabilization of suicide risk and mood, stabilization of command  auditory hallucinations and any associated anxiety, and generalization  of the capacity for safe, effective participation in outpatient  treatment      Lalla Brothers, MD  Electronically Signed     GEJ/MEDQ  D:  08/02/2008  T:  08/02/2008  Job:  [id number]
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
## 2012-09-23 ENCOUNTER — Ambulatory Visit (INDEPENDENT_AMBULATORY_CARE_PROVIDER_SITE_OTHER): Payer: BC Managed Care – PPO | Admitting: Family Medicine

## 2012-09-23 VITALS — BP 108/70 | HR 73 | Temp 98.3°F | Resp 16 | Ht 65.0 in | Wt 120.0 lb

## 2012-09-23 DIAGNOSIS — M25531 Pain in right wrist: Secondary | ICD-10-CM

## 2012-09-23 DIAGNOSIS — G5601 Carpal tunnel syndrome, right upper limb: Secondary | ICD-10-CM

## 2012-09-23 DIAGNOSIS — M25539 Pain in unspecified wrist: Secondary | ICD-10-CM

## 2012-09-23 MED ORDER — MELOXICAM 15 MG PO TABS: 15.0000 mg | ORAL_TABLET | Freq: Every day | ORAL | Status: DC

## 2012-09-23 NOTE — Progress Notes (Signed)
  Subjective:    Patient ID: Terry Schneider, female    DOB: 1990-08-18, 22 y.o.   MRN: [id number]  HPI  Has pain in hand for two weeks and it has moved up into arm today. No injury to hand. Was diagnosed with fibromyalgia 6 months ago and this pain feels different.Hurts all the time, feels like a rubber band is cutting off circulation. Hand and arm feels like dead weight, and feels weak. 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  Neurotin not helping her arm pain or her fibromyalgia pain. Neurotin helped at first with fibromyalgia pain, but recently has not been helping. Will be seeing her regular doctor who treats her fibromyalgia within the next two weeks.    Review of Systems   No pain in neck.Some pain in right shoulder.  Objective:   Physical Exam Pleasant female in NAD. Smiling while being examined and never grimaces or winces with maneuvers that she says elicit pain. Bilateral hands cool to touch. Radial pulses strong, brisk cap refill. No tendon, joint, muscle swelling in hand, forearm or elbow. Tender to exam of all areas. Grip weak bilaterally. Finger flexion weak. Neck, shoulders, elbows, wrists and fingers with full ROM.      Assessment & Plan:  Wrist pain, right  1- Questionable carpel tunnel syndrome- patient fitted with wrist brace and instructed to use at night and during the day as needed for comfort.Instructed to perform ROM exercises when not wearing brace. Wrist pain instruction sheet provided. Meloxicam 15 mg po qd Patient has an appointment with her PCP in 1.5 weeks and can follow up with her.

## 2012-09-23 NOTE — Patient Instructions (Signed)
Wrist Pain  Wrist injuries are frequent in adults and children. A sprain is an injury to the ligaments that hold your bones together. A strain is an injury to muscle or muscle cord-like structures (tendons) from stretching or pulling. Generally, when wrists are moderately tender to touch following a fall or injury, a break in the bone (fracture) may be present. Most wrist sprains or strains are better in 3 to 5 days, but complete healing may take several weeks.  HOME CARE INSTRUCTIONS    Put ice on the injured area.   Put ice in a plastic bag.   Place a towel between your skin and the bag.   Leave the ice on for 15-20 minutes, 3-4 times a day, for the first 2 days.   Keep your arm raised above the level of your heart whenever possible to reduce swelling and pain.   Rest the injured area for at least 48 hours or as directed by your caregiver.   If a splint or elastic bandage has been applied, use it for as long as directed by your caregiver or until seen by a caregiver for a follow-up exam.   Only take over-the-counter or prescription medicines for pain, discomfort, or fever as directed by your caregiver.   Keep all follow-up appointments. You may need to follow up with a specialist or have follow-up X-rays. Improvement in pain level is not a guarantee that you did not fracture a bone in your wrist. The only way to determine whether or not you have a broken bone is by X-ray.  SEEK IMMEDIATE MEDICAL CARE IF:    Your fingers are swollen, very red, white, or cold and blue.   Your fingers are numb or tingling.   You have increasing pain.   You have difficulty moving your fingers.  MAKE SURE YOU:    Understand these instructions.   Will watch your condition.   Will get help right away if you are not doing well or get worse.  Document Released: 10/16/2004 Document Revised: 03/31/2011 Document Reviewed: 02/27/2010  ExitCare Patient Information 2014 [employer].

## 2012-11-01 NOTE — Progress Notes (Signed)
History and physical exam obtained with Terry Sprang, NP.  Neck with full ROM without pain.  Non-tender cervical spine.  R shoulder: non-tender; full ROM; empty can negative; cross over negative.  R elbow: full ROM elbow; non-tender; normal pronation and supination.  R wrist: non-tender; full ROM; R hand: non-tender; no swelling; full ROM; grip 5/5.  A/P: Pain R arm:  New. 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 Likely CTS R:  New.  Fitted with wrist splint; rx for Meloxicam provided to use daily for two weeks and then PRN.  Follow-up with PCP in upcoming 2-4 weeks if no improvement.  Benign musculoskeletal exam in office.

## 2012-12-24 ENCOUNTER — Other Ambulatory Visit: Payer: Self-pay | Admitting: Family Medicine

## 2012-12-24 DIAGNOSIS — R7989 Other specified abnormal findings of blood chemistry: Secondary | ICD-10-CM

## 2012-12-30 ENCOUNTER — Ambulatory Visit
Admission: RE | Admit: 2012-12-30 | Discharge: 2012-12-30 | Disposition: A | Discharge status: Home / Self Care | Payer: BC Managed Care – PPO | Source: Ambulatory Visit | Attending: Family Medicine | Admitting: Family Medicine

## 2012-12-30 DIAGNOSIS — R7989 Other specified abnormal findings of blood chemistry: Secondary | ICD-10-CM

## 2012-12-30 MED ORDER — IOHEXOL 350 MG/ML SOLN
100.0000 mL | Freq: Once | INTRAVENOUS | Status: AC | PRN
  Administered 2012-12-30: 100 mL via INTRAVENOUS

## 2013-01-01 ENCOUNTER — Emergency Department (HOSPITAL_COMMUNITY)
Admission: EM | Admit: 2013-01-01 | Discharge: 2013-01-02 | Disposition: A | Discharge status: Home / Self Care | Payer: BC Managed Care – PPO | Attending: Emergency Medicine | Admitting: Emergency Medicine

## 2013-01-01 DIAGNOSIS — Y9389 Activity, other specified: Secondary | ICD-10-CM | POA: Diagnosis not present

## 2013-01-01 DIAGNOSIS — Y92410 Unspecified street and highway as the place of occurrence of the external cause: Secondary | ICD-10-CM | POA: Diagnosis not present

## 2013-01-01 DIAGNOSIS — S0993XA Unspecified injury of face, initial encounter: Secondary | ICD-10-CM | POA: Diagnosis present

## 2013-01-01 DIAGNOSIS — S139XXA Sprain of joints and ligaments of unspecified parts of neck, initial encounter: Secondary | ICD-10-CM | POA: Insufficient documentation

## 2013-01-01 DIAGNOSIS — Z79899 Other long term (current) drug therapy: Secondary | ICD-10-CM | POA: Insufficient documentation

## 2013-01-01 DIAGNOSIS — S161XXA Strain of muscle, fascia and tendon at neck level, initial encounter: Secondary | ICD-10-CM

## 2013-01-01 DIAGNOSIS — J45909 Unspecified asthma, uncomplicated: Secondary | ICD-10-CM | POA: Insufficient documentation

## 2013-01-01 DIAGNOSIS — IMO0001 Reserved for inherently not codable concepts without codable children: Secondary | ICD-10-CM | POA: Insufficient documentation

## 2013-01-01 HISTORY — DX: Unspecified asthma, uncomplicated: J45.909
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
## 2013-01-02 ENCOUNTER — Emergency Department (HOSPITAL_COMMUNITY): Payer: BC Managed Care – PPO

## 2013-01-02 ENCOUNTER — Encounter (HOSPITAL_COMMUNITY): Payer: Self-pay | Admitting: Emergency Medicine

## 2013-01-02 MED ORDER — IOHEXOL 350 MG/ML SOLN
80.0000 mL | Freq: Once | INTRAVENOUS | Status: AC | PRN
  Administered 2013-01-02: 80 mL via INTRAVENOUS

## 2013-01-02 MED ORDER — ONDANSETRON HCL 4 MG/2ML IJ SOLN
4.0000 mg | Freq: Once | INTRAMUSCULAR | Status: AC
  Administered 2013-01-02: 4 mg via INTRAVENOUS
  Filled 2013-01-02: qty 2

## 2013-01-02 MED ORDER — CYCLOBENZAPRINE HCL 10 MG PO TABS: 10.0000 mg | ORAL_TABLET | Freq: Two times a day (BID) | ORAL | Status: DC | PRN

## 2013-01-02 MED ORDER — CYCLOBENZAPRINE HCL 10 MG PO TABS
10.0000 mg | ORAL_TABLET | Freq: Once | ORAL | Status: AC
  Administered 2013-01-02: 10 mg via ORAL
  Filled 2013-01-02: qty 1

## 2013-01-02 MED ORDER — MORPHINE SULFATE 4 MG/ML IJ SOLN
4.0000 mg | Freq: Once | INTRAMUSCULAR | Status: AC
  Administered 2013-01-02: 4 mg via INTRAVENOUS
  Filled 2013-01-02: qty 1

## 2013-01-02 MED ORDER — HYDROCODONE-ACETAMINOPHEN 5-325 MG PO TABS: 1.0000 | ORAL_TABLET | Freq: Four times a day (QID) | ORAL | Status: DC | PRN

## 2013-01-02 NOTE — ED Notes (Signed)
Patient transported to CT 
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
## 2013-03-06 ENCOUNTER — Encounter (HOSPITAL_COMMUNITY): Payer: Self-pay | Admitting: Emergency Medicine

## 2013-03-06 ENCOUNTER — Emergency Department (HOSPITAL_COMMUNITY): Payer: BC Managed Care – PPO

## 2013-03-06 ENCOUNTER — Emergency Department (HOSPITAL_COMMUNITY)
Admission: EM | Admit: 2013-03-06 | Discharge: 2013-03-06 | Disposition: A | Discharge status: Home / Self Care | Payer: BC Managed Care – PPO | Attending: Emergency Medicine | Admitting: Emergency Medicine

## 2013-03-06 DIAGNOSIS — Z3202 Encounter for pregnancy test, result negative: Secondary | ICD-10-CM | POA: Insufficient documentation

## 2013-03-06 DIAGNOSIS — Z975 Presence of (intrauterine) contraceptive device: Secondary | ICD-10-CM | POA: Insufficient documentation

## 2013-03-06 DIAGNOSIS — R509 Fever, unspecified: Secondary | ICD-10-CM | POA: Insufficient documentation

## 2013-03-06 DIAGNOSIS — F509 Eating disorder, unspecified: Secondary | ICD-10-CM | POA: Insufficient documentation

## 2013-03-06 DIAGNOSIS — G8929 Other chronic pain: Secondary | ICD-10-CM | POA: Insufficient documentation

## 2013-03-06 DIAGNOSIS — IMO0001 Reserved for inherently not codable concepts without codable children: Secondary | ICD-10-CM | POA: Insufficient documentation

## 2013-03-06 DIAGNOSIS — R109 Unspecified abdominal pain: Secondary | ICD-10-CM | POA: Insufficient documentation

## 2013-03-06 DIAGNOSIS — R5383 Other fatigue: Secondary | ICD-10-CM

## 2013-03-06 DIAGNOSIS — R0600 Dyspnea, unspecified: Secondary | ICD-10-CM

## 2013-03-06 DIAGNOSIS — Z938 Other artificial opening status: Secondary | ICD-10-CM | POA: Insufficient documentation

## 2013-03-06 DIAGNOSIS — R079 Chest pain, unspecified: Secondary | ICD-10-CM | POA: Insufficient documentation

## 2013-03-06 DIAGNOSIS — R231 Pallor: Secondary | ICD-10-CM | POA: Insufficient documentation

## 2013-03-06 DIAGNOSIS — J309 Allergic rhinitis, unspecified: Secondary | ICD-10-CM | POA: Insufficient documentation

## 2013-03-06 DIAGNOSIS — R630 Anorexia: Secondary | ICD-10-CM | POA: Insufficient documentation

## 2013-03-06 DIAGNOSIS — R51 Headache: Secondary | ICD-10-CM | POA: Insufficient documentation

## 2013-03-06 DIAGNOSIS — R1110 Vomiting, unspecified: Secondary | ICD-10-CM | POA: Insufficient documentation

## 2013-03-06 DIAGNOSIS — M549 Dorsalgia, unspecified: Secondary | ICD-10-CM | POA: Insufficient documentation

## 2013-03-06 DIAGNOSIS — Z79899 Other long term (current) drug therapy: Secondary | ICD-10-CM | POA: Insufficient documentation

## 2013-03-06 DIAGNOSIS — J45901 Unspecified asthma with (acute) exacerbation: Secondary | ICD-10-CM | POA: Insufficient documentation

## 2013-03-06 DIAGNOSIS — M797 Fibromyalgia: Secondary | ICD-10-CM

## 2013-03-06 DIAGNOSIS — R5381 Other malaise: Secondary | ICD-10-CM | POA: Insufficient documentation

## 2013-03-06 DIAGNOSIS — M542 Cervicalgia: Secondary | ICD-10-CM | POA: Insufficient documentation

## 2013-03-06 HISTORY — DX: Anorexia: R63.0

## 2013-03-06 LAB — GLUCOSE, CAPILLARY
GLUCOSE-CAPILLARY: 81 mg/dL (ref 70–99)
Glucose-Capillary: 65 mg/dL — ABNORMAL LOW (ref 70–99)

## 2013-03-06 LAB — COMPREHENSIVE METABOLIC PANEL
ALBUMIN: 4.6 g/dL (ref 3.5–5.2)
ALT: 15 U/L (ref 0–35)
AST: 21 U/L (ref 0–37)
Alkaline Phosphatase: 50 U/L (ref 39–117)
BUN: 11 mg/dL (ref 6–23)
CALCIUM: 9.6 mg/dL (ref 8.4–10.5)
CO2: 22 mEq/L (ref 19–32)
CREATININE: 0.68 mg/dL (ref 0.50–1.10)
Chloride: 104 mEq/L (ref 96–112)
GFR calc Af Amer: >90 mL/min (ref >90)
GFR calc non Af Amer: >90 mL/min (ref >90)
Glucose, Bld: 85 mg/dL (ref 70–99)
Potassium: 3.8 mEq/L (ref 3.7–5.3)
Sodium: 140 mEq/L (ref 137–147)
Total Bilirubin: <0.2 mg/dL — ABNORMAL LOW (ref 0.3–1.2)
Total Protein: 8.1 g/dL (ref 6.0–8.3)

## 2013-03-06 LAB — CBC
HCT: 39.4 % (ref 36.0–46.0)
HEMOGLOBIN: 12.9 g/dL (ref 12.0–15.0)
MCH: 28.6 pg (ref 26.0–34.0)
MCHC: 32.7 g/dL (ref 30.0–36.0)
MCV: 87.4 fL (ref 78.0–100.0)
Platelets: 273 10*3/uL (ref 150–400)
RBC: 4.51 MIL/uL (ref 3.87–5.11)
RDW: 13.8 % (ref 11.5–15.5)
WBC: 5.8 10*3/uL (ref 4.0–10.5)

## 2013-03-06 LAB — POCT I-STAT TROPONIN I: TROPONIN I, POC: 0 ng/mL (ref 0.00–0.08)

## 2013-03-06 LAB — POCT PREGNANCY, URINE: Preg Test, Ur: NEGATIVE

## 2013-03-06 MED ORDER — KETOROLAC TROMETHAMINE 30 MG/ML IJ SOLN
30.0000 mg | Freq: Once | INTRAMUSCULAR | Status: AC
  Administered 2013-03-06: 30 mg via INTRAVENOUS
  Filled 2013-03-06: qty 1

## 2013-03-06 MED ORDER — SODIUM CHLORIDE 0.9 % IV BOLUS (SEPSIS)
1000.0000 mL | Freq: Once | INTRAVENOUS | Status: AC
  Administered 2013-03-06: 1000 mL via INTRAVENOUS

## 2013-03-06 NOTE — ED Notes (Signed)
Patient states she is feeling better.  She has removed all of her cardiac monitoring and pulse ox.  ermd aware that she is better and wants to go home

## 2013-03-06 NOTE — Discharge Instructions (Signed)
Chest Wall Pain Chest wall pain is pain in or around the bones and muscles of your chest. It may take up to 6 weeks to get better. It may take longer if you must stay physically active in your work and activities.  CAUSES  Chest wall pain may happen on its own. However, it may be caused by:  A viral illness like the flu.  Injury.  Coughing.  Exercise.  Arthritis.  Fibromyalgia.  Shingles. HOME CARE INSTRUCTIONS   Avoid overtiring physical activity. Try not to strain or perform activities that cause pain. This includes any activities using your chest or your abdominal and side muscles, especially if heavy weights are used.  Put ice on the sore area.  Put ice in a plastic bag.  Place a towel between your skin and the bag.  Leave the ice on for 15-20 minutes per hour while awake for the first 2 days.  Only take over-the-counter or prescription medicines for pain, discomfort, or fever as directed by your caregiver. SEEK IMMEDIATE MEDICAL CARE IF:   Your pain increases, or you are very uncomfortable.  You have a fever.  Your chest pain becomes worse.  You have new, unexplained symptoms.  You have nausea or vomiting.  You feel sweaty or lightheaded.  You have a cough with phlegm (sputum), or you cough up blood. MAKE SURE YOU:   Understand these instructions.  Will watch your condition.  Will get help right away if you are not doing well or get worse. Document Released: 01/06/2005 Document Revised: 03/31/2011 Document Reviewed: 09/02/2010 [facility] Patient Information 2014 [place], [place].  Chronic Pain Chronic pain can be defined as pain that is off and on and lasts for 3 6 months or longer. Many things cause chronic pain, which can make it difficult to make a diagnosis. There are many treatment options available for chronic pain. However, finding a treatment that works well for you may require trying various approaches until the right one is found. Many people  benefit from a combination of two or more types of treatment to control their pain. SYMPTOMS  Chronic pain can occur anywhere in the body and can range from mild to very severe. Some types of chronic pain include:  Headache.  Low back pain.  Cancer pain.  Arthritis pain.  Neurogenic pain. This is pain resulting from damage to nerves. People with chronic pain may also have other symptoms such as:  Depression.  Anger.  Insomnia.  Anxiety. DIAGNOSIS  Your health care provider will help diagnose your condition over time. In many cases, the initial focus will be on excluding possible conditions that could be causing the pain. Depending on your symptoms, your health care provider may order tests to diagnose your condition. Some of these tests may include:   Blood tests.   CT scan.   MRI.   X-rays.   Ultrasounds.   Nerve conduction studies.  You may need to see a specialist.  TREATMENT  Finding treatment that works well may take time. You may be referred to a pain specialist. He or she may prescribe medicine or therapies, such as:   Mindful meditation or yoga.  Shots (injections) of numbing or pain-relieving medicines into the spine or area of pain.  Local electrical stimulation.  Acupuncture.   Massage therapy.   Aroma, color, light, or sound therapy.   Biofeedback.   Working with a physical therapist to keep from getting stiff.   Regular, gentle exercise.   Cognitive or behavioral  therapy.   Group support.  Sometimes, surgery may be recommended.  HOME CARE INSTRUCTIONS   Take all medicines as directed by your health care provider.   Lessen stress in your life by relaxing and doing things such as listening to calming music.   Exercise or be active as directed by your health care provider.   Eat a healthy diet and include things such as vegetables, fruits, fish, and lean meats in your diet.   Keep all follow-up appointments with your  health care provider.   Attend a support group with others suffering from chronic pain. SEEK MEDICAL CARE IF:   Your pain gets worse.   You develop a new pain that was not there before.   You cannot tolerate medicines given to you by your health care provider.   You have new symptoms since your last visit with your health care provider.  SEEK IMMEDIATE MEDICAL CARE IF:   You feel weak.   You have decreased sensation or numbness.   You lose control of bowel or bladder function.   Your pain suddenly gets much worse.   You develop shaking.  You develop chills.  You develop confusion.  You develop chest pain.  You develop shortness of breath.  MAKE SURE YOU:  Understand these instructions.  Will watch your condition.  Will get help right away if you are not doing well or get worse. Document Released: 09/28/2001 Document Revised: 09/08/2012 Document Reviewed: 07/02/2012 [facility] Patient Information 2014 [place].  Fibromyalgia Fibromyalgia is a disorder that is often misunderstood. It is associated with muscular pains and tenderness that comes and goes. It is often associated with fatigue and sleep disturbances. Though it tends to be long-lasting, fibromyalgia is not life-threatening. CAUSES  The exact cause of fibromyalgia is unknown. People with certain gene types are predisposed to developing fibromyalgia and other conditions. Certain factors can play a role as triggers, such as:  Spine disorders.  Arthritis.  Severe injury (trauma) and other physical stressors.  Emotional stressors. SYMPTOMS   The main symptom is pain and stiffness in the muscles and joints, which can vary over time.  Sleep and fatigue problems. Other related symptoms may include:  Bowel and bladder problems.  Headaches.  Visual problems.  Problems with odors and noises.  Depression or mood changes.  Painful periods (dysmenorrhea).  Dryness of the skin or  eyes. DIAGNOSIS  There are no specific tests for diagnosing fibromyalgia. Patients can be diagnosed accurately from the specific symptoms they have. The diagnosis is made by determining that nothing else is causing the problems. TREATMENT  There is no cure. Management includes medicines and an active, healthy lifestyle. The goal is to enhance physical fitness, decrease pain, and improve sleep. HOME CARE INSTRUCTIONS   Only take over-the-counter or prescription medicines as directed by your caregiver. Sleeping pills, tranquilizers, and pain medicines may make your problems worse.  Low-impact aerobic exercise is very important and advised for treatment. At first, it may seem to make pain worse. Gradually increasing your tolerance will overcome this feeling.  Learning relaxation techniques and how to control stress will help you. Biofeedback, visual imagery, hypnosis, muscle relaxation, yoga, and meditation are all options.  Anti-inflammatory medicines and physical therapy may provide short-term help.  Acupuncture or massage treatments may help.  Take muscle relaxant medicines as suggested by your caregiver.  Avoid stressful situations.  Plan a healthy lifestyle. This includes your diet, sleep, rest, exercise, and friends.  Find and practice a hobby you  enjoy.  Join a fibromyalgia support group for interaction, ideas, and sharing advice. This may be helpful. SEEK MEDICAL CARE IF:  You are not having good results or improvement from your treatment. FOR MORE INFORMATION  National Fibromyalgia Association: www.fmaware.org Arthritis Foundation: www.arthritis.org Document Released: 01/06/2005 Document Revised: 03/31/2011 Document Reviewed: 04/18/2009 [facility] Patient Information 2014 [place], [place].  Shortness of Breath Shortness of breath means you have trouble breathing. Shortness of breath may indicate that you have a medical problem. You should seek immediate medical care for  shortness of breath. CAUSES   Not enough oxygen in the air (as with high altitudes or a smoke-filled room).  Short-term (acute) lung disease, including:  Infections, such as pneumonia.  Fluid in the lungs, such as heart failure.  A blood clot in the lungs (pulmonary embolism).  Long-term (chronic) lung diseases.  Heart disease (heart attack, angina, heart failure, and others).  Low red blood cells (anemia).  Poor physical fitness. This can cause shortness of breath when you exercise.  Chest or back injuries or stiffness.  Being overweight.  Smoking.  Anxiety. This can make you feel like you are not getting enough air. DIAGNOSIS  Serious medical problems can usually be found during your physical exam. Tests may also be done to determine why you are having shortness of breath. Tests may include:  Chest X-rays.  Lung function tests.  Blood tests.  Electrocardiography.  Exercise testing.  Echocardiography.  Imaging scans. Your caregiver may not be able to find a cause for your shortness of breath after your exam. In this case, it is important to have a follow-up exam with your caregiver as directed.  TREATMENT  Treatment for shortness of breath depends on the cause of your symptoms and can vary greatly. HOME CARE INSTRUCTIONS   Do not smoke. Smoking is a common cause of shortness of breath. If you smoke, ask for help to quit.  Avoid being around chemicals or things that may bother your breathing, such as paint fumes and dust.  Rest as needed. Slowly resume your usual activities.  If medicines were prescribed, take them as directed for the full length of time directed. This includes oxygen and any inhaled medicines.  Keep all follow-up appointments as directed by your caregiver. SEEK MEDICAL CARE IF:   Your condition does not improve in the time expected.  You have a hard time doing your normal activities even with rest.  You have any side effects or  problems with the medicines prescribed.  You develop any new symptoms. SEEK IMMEDIATE MEDICAL CARE IF:   Your shortness of breath gets worse.  You feel lightheaded, faint, or develop a cough not controlled with medicines.  You start coughing up blood.  You have pain with breathing.  You have chest pain or pain in your arms, shoulders, or abdomen.  You have a fever.  You are unable to walk up stairs or exercise the way you normally do. MAKE SURE YOU:  Understand these instructions.  Will watch your condition.  Will get help right away if you are not doing well or get worse. Document Released: 10/01/2000 Document Revised: 07/08/2011 Document Reviewed: 03/24/2011 [facility] Patient Information 2014 [place], [place].

## 2013-03-06 NOTE — ED Notes (Signed)
Patient reports she is feeling better.  No s/sx of distress.

## 2013-03-06 NOTE — ED Notes (Signed)
Patient reports she is feeling better.  She is to follow up with her nutritionist and her Md regarding ED visit.  Patient encouraged to eat regular meals and stay hydrated.  Patient admits to working out x 2 hours daily.  Advised to rest for the next couple of days.

## 2013-03-06 NOTE — ED Provider Notes (Signed)
CSN: [id number]     Arrival date & time 03/06/13  4098 History   First MD Initiated Contact with Patient 03/06/13 0913     Chief Complaint  Patient presents with  . Shortness of Breath  . Generalized Body Aches  . Eating Disorder     (Consider location/radiation/quality/duration/timing/severity/associated sxs/prior Treatment) HPI Comments: Patient is 23 year old female with history of fibromyalgia, asthma and anorexia who presents to the emergency department with multiple complaints.  She reports her symptoms have been worsening over the past 6 months to include shortness of breath with any amount of exertion, pain everywhere including headache, chest pain, abdominal pain, and bilateral extremity pain.  She has been taking the [facility] for her fibromyalgia pain but states that this is not helping.  She has seen her PCP for this but states that "they are not doing anything".  She states that her shortness of breath and chronic pain have set off her anorexia and so she is not eating as well as making herself vomit over the past month.  She reports generalized weakness.  Patient is a 23 y.o. female presenting with shortness of breath. The history is provided by the patient. No language interpreter was used.  Shortness of Breath Severity:  Moderate Onset quality:  Gradual Duration:  6 months Timing:  Constant Progression:  Worsening Chronicity:  Recurrent Context: activity   Context: not smoke exposure and not URI   Relieved by:  Nothing Worsened by:  Nothing tried Ineffective treatments:  None tried Associated symptoms: abdominal pain, chest pain, fever, headaches, neck pain and vomiting   Associated symptoms: no claudication, no cough, no diaphoresis, no ear pain, no hemoptysis, no PND, no rash, no sore throat, no sputum production, no swollen glands and no wheezing   Risk factors: oral contraceptive use   Risk factors: no hx of PE/DVT and no prolonged immobilization     Past Medical  History  Diagnosis Date  . Asthma   . Anorexia    Past Surgical History  Procedure Laterality Date  . Tympanostomy tube placement     No family history on file. History  Substance Use Topics  . Smoking status: Never Smoker   . Smokeless tobacco: Not on file  . Alcohol Use: No   OB History   Grav Para Term Preterm Abortions TAB SAB Ect Mult Living                 Review of Systems  Constitutional: Positive for fever and appetite change. Negative for diaphoresis.  HENT: Negative for ear pain and sore throat.   Respiratory: Positive for shortness of breath. Negative for cough, hemoptysis, sputum production and wheezing.   Cardiovascular: Positive for chest pain. Negative for claudication and PND.  Gastrointestinal: Positive for vomiting and abdominal pain. Negative for diarrhea and constipation.  Genitourinary: Positive for pelvic pain. Negative for dysuria, vaginal bleeding and vaginal discharge.  Musculoskeletal: Positive for arthralgias, back pain, myalgias and neck pain. Negative for joint swelling.  Skin: Negative for rash.  Allergic/Immunologic: Positive for environmental allergies.  Neurological: Positive for headaches.  All other systems reviewed and are negative.      Allergies  Review of patient's allergies indicates no known allergies.  Home Medications   Current Outpatient Rx  Name  Route  Sig  Dispense  Refill  . clonazePAM (KLONOPIN) 0.5 MG tablet   Oral   Take 0.5 mg by mouth 3 (three) times daily as needed for anxiety.         Marland Kitchen  levonorgestrel (MIRENA) 20 MCG/24HR IUD   Intrauterine   1 each by Intrauterine route once.         . Milnacipran HCl (SAVELLA PO)   Oral   Take 100 mg by mouth 2 (two) times daily.          Marland Kitchen OVER THE COUNTER MEDICATION   Oral   Take 2 tablets by mouth once. Back and Body NSAID         . perphenazine (TRILAFON) 2 MG tablet   Oral   Take 2 mg by mouth 3 (three) times daily as needed.         . topiramate  (TOPAMAX) 50 MG tablet   Oral   Take 50 mg by mouth daily as needed (Headache).          . traZODone (DESYREL) 100 MG tablet   Oral   Take 300 mg by mouth 3 (three) times daily.           BP 97/68  Pulse 86  Temp(Src) 97.7 F (36.5 C) (Oral)  Resp 18  Ht 5\' 5"  (1.651 m)  SpO2 100%  LMP 03/03/2013 Physical Exam  Nursing note and vitals reviewed. Constitutional: She is oriented to person, place, and time. She appears well-developed and well-nourished. No distress.  HENT:  Head: Normocephalic and atraumatic.  Right Ear: External ear normal.  Left Ear: External ear normal.  Nose: Nose normal.  Mouth/Throat: Oropharynx is clear and moist. No oropharyngeal exudate.  Eyes: Conjunctivae are normal. Pupils are equal, round, and reactive to light. No scleral icterus.  Neck: Normal range of motion. Neck supple.  Cardiovascular: Normal rate, regular rhythm and normal heart sounds.  Exam reveals no gallop and no friction rub.   No murmur heard. Pulmonary/Chest: Effort normal and breath sounds normal. No respiratory distress. She has no wheezes. She has no rales. She exhibits tenderness.  Diffuse chest wall tenderness to palpation  Abdominal: Soft. Bowel sounds are normal. She exhibits no distension. There is tenderness. There is no rebound and no guarding.  Diffuse abdominal tenderness to palpation  Musculoskeletal: Normal range of motion. She exhibits tenderness. She exhibits no edema.  Diffuse myalgias and arthralgias without erythema, swelling  Lymphadenopathy:    She has no cervical adenopathy.  Neurological: She is alert and oriented to person, place, and time. She exhibits normal muscle tone. Coordination normal.  Skin: Skin is warm and dry. No rash noted. No erythema. There is pallor.  Psychiatric: She has a normal mood and affect. Her behavior is normal. Judgment and thought content normal.    ED Course  Procedures (including critical care time) Labs Review Labs Reviewed   GLUCOSE, CAPILLARY - Abnormal; Notable for the following:    Glucose-Capillary 65 (*)    All other components within normal limits  GLUCOSE, CAPILLARY  CBC  COMPREHENSIVE METABOLIC PANEL  POCT I-STAT TROPONIN I   Imaging Review Dg Chest 2 View (if Patient Has Fever And/or Copd)  03/06/2013   CLINICAL DATA:  Shortness of breath and chest pain.  EXAM: CHEST - 2 VIEW  COMPARISON:  CT ANGIO CHEST W/CM &/OR WO/CM dated 12/30/2012; DG CHEST 2V dated 12/23/2012  FINDINGS: The heart size and mediastinal contours are within normal limits. There is no evidence of pulmonary edema, consolidation, pneumothorax, nodule or pleural fluid. The visualized skeletal structures are unremarkable.  IMPRESSION: No active disease.   Electronically Signed   By: Irish Lack M.D.   On: 03/06/2013 10:09    EKG Interpretation  None      Results for orders placed during the hospital encounter of 03/06/13  CBC      Result Value Ref Range   WBC 5.8  4.0 - 10.5 K/uL   RBC 4.51  3.87 - 5.11 MIL/uL   Hemoglobin 12.9  12.0 - 15.0 g/dL   HCT 16.139.4  09.636.0 - 04.546.0 %   MCV 87.4  78.0 - 100.0 fL   MCH 28.6  26.0 - 34.0 pg   MCHC 32.7  30.0 - 36.0 g/dL   RDW 40.913.8  81.111.5 - 91.415.5 %   Platelets 273  150 - 400 K/uL  GLUCOSE, CAPILLARY      Result Value Ref Range   Glucose-Capillary 65 (*) 70 - 99 mg/dL  COMPREHENSIVE METABOLIC PANEL      Result Value Ref Range   Sodium 140  137 - 147 mEq/L   Potassium 3.8  3.7 - 5.3 mEq/L   Chloride 104  96 - 112 mEq/L   CO2 22  19 - 32 mEq/L   Glucose, Bld 85  70 - 99 mg/dL   BUN 11  6 - 23 mg/dL   Creatinine, Ser 7.820.68  0.50 - 1.10 mg/dL   Calcium 9.6  8.4 - 95.610.5 mg/dL   Total Protein 8.1  6.0 - 8.3 g/dL   Albumin 4.6  3.5 - 5.2 g/dL   AST 21  0 - 37 U/L   ALT 15  0 - 35 U/L   Alkaline Phosphatase 50  39 - 117 U/L   Total Bilirubin <0.2 (*) 0.3 - 1.2 mg/dL   GFR calc non Af Amer >90  >90 mL/min   GFR calc Af Amer >90  >90 mL/min  GLUCOSE, CAPILLARY      Result Value Ref Range    Glucose-Capillary 81  70 - 99 mg/dL  POCT I-STAT TROPONIN I      Result Value Ref Range   Troponin i, poc 0.00  0.00 - 0.08 ng/mL   Comment 3           POCT PREGNANCY, URINE      Result Value Ref Range   Preg Test, Ur NEGATIVE  NEGATIVE   Dg Chest 2 View (if Patient Has Fever And/or Copd)  03/06/2013   CLINICAL DATA:  Shortness of breath and chest pain.  EXAM: CHEST - 2 VIEW  COMPARISON:  CT ANGIO CHEST W/CM &/OR WO/CM dated 12/30/2012; DG CHEST 2V dated 12/23/2012  FINDINGS: The heart size and mediastinal contours are within normal limits. There is no evidence of pulmonary edema, consolidation, pneumothorax, nodule or pleural fluid. The visualized skeletal structures are unremarkable.  IMPRESSION: No active disease.   Electronically Signed   By: Irish LackGlenn  Yamagata M.D.   On: 03/06/2013 10:09   Medications  sodium chloride 0.9 % bolus 1,000 mL (1,000 mLs Intravenous New Bag/Given 03/06/13 0950)  ketorolac (TORADOL) 30 MG/ML injection 30 mg (30 mg Intravenous Given 03/06/13 1043)     MDM   Fibromyalgia Dyspnea  Patient here with worsening of her chronic pain as well as shortness of breath.  She has normal examination, x-ray and labs, after medication here she reports that she is feeling much better and would like to go home.  I suspect there to be an anxiety component to this as well.  I do not suspect any other acute emergency medical condition at this time.   Izola PriceFrances C. Marisue HumbleSanford, PA-C 03/06/13 1214

## 2013-03-06 NOTE — ED Notes (Signed)
Pt reports that she has shortness of breath ongoing for 6 months, bodyaches ongoing for a long time and an eating disorder, anorexia. Pt is requesting to get glucose. Pt lethargic in triage.

## 2013-03-06 NOTE — ED Notes (Signed)
CBG 81.  

## 2013-03-10 NOTE — ED Provider Notes (Signed)
Medical screening examination/treatment/procedure(s) were performed by non-physician practitioner and as supervising physician I was immediately available for consultation/collaboration.  EKG Interpretation    Date/Time:  Sunday March 06 2013 09:01:48 EST Ventricular Rate:  96 PR Interval:  120 QRS Duration: 76 QT Interval:  354 QTC Calculation: 447 R Axis:   83 Text Interpretation:  Normal sinus rhythm Nonspecific ST abnormality Abnormal ECG ED PHYSICIAN INTERPRETATION AVAILABLE IN [facility] Confirmed by TEST, RECORD ([id number]) on 03/08/2013 7:05:59 AM              Rolland PorterMark Lisseth Brazeau, MD 03/10/13 [phone]

## 2013-04-20 ENCOUNTER — Encounter (HOSPITAL_COMMUNITY): Payer: Self-pay | Admitting: Emergency Medicine

## 2013-04-20 ENCOUNTER — Emergency Department (HOSPITAL_COMMUNITY)
Admission: EM | Admit: 2013-04-20 | Discharge: 2013-04-20 | Disposition: A | Discharge status: Home / Self Care | Payer: BC Managed Care – PPO | Attending: Emergency Medicine | Admitting: Emergency Medicine

## 2013-04-20 DIAGNOSIS — R5383 Other fatigue: Secondary | ICD-10-CM

## 2013-04-20 DIAGNOSIS — Z938 Other artificial opening status: Secondary | ICD-10-CM | POA: Insufficient documentation

## 2013-04-20 DIAGNOSIS — Z975 Presence of (intrauterine) contraceptive device: Secondary | ICD-10-CM | POA: Insufficient documentation

## 2013-04-20 DIAGNOSIS — R1110 Vomiting, unspecified: Secondary | ICD-10-CM | POA: Insufficient documentation

## 2013-04-20 DIAGNOSIS — R636 Underweight: Secondary | ICD-10-CM | POA: Insufficient documentation

## 2013-04-20 DIAGNOSIS — R109 Unspecified abdominal pain: Secondary | ICD-10-CM | POA: Insufficient documentation

## 2013-04-20 DIAGNOSIS — F509 Eating disorder, unspecified: Secondary | ICD-10-CM

## 2013-04-20 DIAGNOSIS — E860 Dehydration: Secondary | ICD-10-CM | POA: Insufficient documentation

## 2013-04-20 DIAGNOSIS — J45909 Unspecified asthma, uncomplicated: Secondary | ICD-10-CM | POA: Insufficient documentation

## 2013-04-20 DIAGNOSIS — R5381 Other malaise: Secondary | ICD-10-CM | POA: Insufficient documentation

## 2013-04-20 DIAGNOSIS — Z79899 Other long term (current) drug therapy: Secondary | ICD-10-CM | POA: Insufficient documentation

## 2013-04-20 LAB — CBC WITH DIFFERENTIAL/PLATELET
BASOS PCT: 0 % (ref 0–1)
Basophils Absolute: 0 10*3/uL (ref 0.0–0.1)
EOS ABS: 0.1 10*3/uL (ref 0.0–0.7)
EOS PCT: 2 % (ref 0–5)
HCT: 36.7 % (ref 36.0–46.0)
Hemoglobin: 12.5 g/dL (ref 12.0–15.0)
Lymphocytes Relative: 35 % (ref 12–46)
Lymphs Abs: 1.7 10*3/uL (ref 0.7–4.0)
MCH: 28.6 pg (ref 26.0–34.0)
MCHC: 34.1 g/dL (ref 30.0–36.0)
MCV: 84 fL (ref 78.0–100.0)
Monocytes Absolute: 0.4 10*3/uL (ref 0.1–1.0)
Monocytes Relative: 8 % (ref 3–12)
NEUTROS PCT: 55 % (ref 43–77)
Neutro Abs: 2.7 10*3/uL (ref 1.7–7.7)
PLATELETS: 233 10*3/uL (ref 150–400)
RBC: 4.37 MIL/uL (ref 3.87–5.11)
RDW: 14.3 % (ref 11.5–15.5)
WBC: 5 10*3/uL (ref 4.0–10.5)

## 2013-04-20 LAB — COMPREHENSIVE METABOLIC PANEL
ALBUMIN: 4.3 g/dL (ref 3.5–5.2)
ALK PHOS: 44 U/L (ref 39–117)
ALT: 15 U/L (ref 0–35)
AST: 19 U/L (ref 0–37)
BUN: 10 mg/dL (ref 6–23)
CALCIUM: 9.4 mg/dL (ref 8.4–10.5)
CO2: 19 mEq/L (ref 19–32)
Chloride: 102 mEq/L (ref 96–112)
Creatinine, Ser: 0.73 mg/dL (ref 0.50–1.10)
GFR calc non Af Amer: >90 mL/min (ref >90)
GLUCOSE: 89 mg/dL (ref 70–99)
POTASSIUM: 3.5 meq/L — AB (ref 3.7–5.3)
Sodium: 135 mEq/L — ABNORMAL LOW (ref 137–147)
TOTAL PROTEIN: 7.6 g/dL (ref 6.0–8.3)
Total Bilirubin: 0.3 mg/dL (ref 0.3–1.2)

## 2013-04-20 LAB — ETHANOL: Alcohol, Ethyl (B): <11 mg/dL (ref 0–11)

## 2013-04-20 LAB — LIPASE, BLOOD: Lipase: 20 U/L (ref 11–59)

## 2013-04-20 MED ORDER — SODIUM CHLORIDE 0.9 % IV SOLN
1000.0000 mL | Freq: Once | INTRAVENOUS | Status: AC
Start: 2013-04-20 — End: 2013-04-20
  Administered 2013-04-20: 1000 mL via INTRAVENOUS

## 2013-04-20 MED ORDER — SODIUM CHLORIDE 0.9 % IV SOLN
1000.0000 mL | Freq: Once | INTRAVENOUS | Status: AC
  Administered 2013-04-20: 1000 mL via INTRAVENOUS

## 2013-04-20 MED ORDER — SODIUM CHLORIDE 0.9 % IV SOLN: 1000.0000 mL | INTRAVENOUS | Status: DC

## 2013-04-20 MED ORDER — ONDANSETRON HCL 4 MG/2ML IJ SOLN
4.0000 mg | Freq: Once | INTRAMUSCULAR | Status: AC
  Administered 2013-04-20: 4 mg via INTRAVENOUS
  Filled 2013-04-20: qty 2

## 2013-04-20 NOTE — ED Notes (Signed)
Pt. Is not able to use the restroom at this time, but is aware that we need a urine specimen. 

## 2013-04-20 NOTE — Discharge Instructions (Signed)
Eating Disorders Eating disorders are medical and psychological problems. They often have psychological or emotional causes. Depression, obsession with food, and a distorted body image are common in patients. Over time, eating disorders can damage your body. The most common eating disorders are:  Bulimia Nervosa. This is when a person eats large amounts of food and cannot stop (binge eating). This is often followed by vomiting, excessive exercise, or taking laxatives (purging). This is done to get rid of the calories eaten. Bulimia may start as a way to control weight. Later, it may be caused by stress or an emotional crisis.  Anorexia Nervosa. This is when a person has an extremely low body weight from severe dieting, compulsive exercising, or both. Losing weight or preventing weight gain becomes an obsession. It is often used as a way to cope with emotional problems.  Eating Disorders Not Otherwise Specified (EDNOS). This diagnosis is used for patients who have some of the symptoms of bulimia nervosa or anorexia nervosa. However, they do not have all the criteria to diagnose a specific disorder. These disorders can lead to serious medical problems. These may include:  Extreme malnutrition.  Hormone imbalance.  Vitamin and mineral deficiencies.  Organ damage. DIAGNOSIS  Your caregiver will do a complete physical exam. A psychological evaluation is also needed. This will include questions about your eating habits and self-image. Blood and urine tests may also be done. TREATMENT  Treatment includes:  Counseling.  Proper nutrition.  Proper exercise.  Sometimes, medicine.  Hospital care in extreme cases. HOME CARE INSTRUCTIONS   Learn about your eating disorder.  Identify situations that cause the urge to overeat or diet.  Develop a plan when you have urges to overeat or diet.  Learn who can support you when you need to talk.  Resist weighing yourself or checking yourself in  the mirror often.  Follow your caregiver's meal and exercise plan.  Keep all follow-up referrals and appointments. This is very important.  Get regular dental care every 6 months. SEEK MEDICAL CARE IF:   You have a rapid weight loss or gain.  You cause yourself to vomit after eating.  You abuse stimulants or diet aids.  You have compulsive exercise habits.  You have an irregular heartbeat (pulse).  You have a constant fear of gaining weight.  You take laxatives after eating.  You have compulsive eating or dieting habits.  You have irregular menstrual periods.  You lose your menstrual periods. SEEK IMMEDIATE MEDICAL CARE IF:   You have blood or brown flecks in your vomit. This may look like coffee grounds.  You have bright red or black, tarry stools.  You have chest pain or pressure.  You have difficulty breathing.  You do not urinate every 8 hours. FOR MORE INFORMATION Alliance for Eating Disorders Awareness: www.allianceforeatingdisorders.com National Association of Anorexia Nervosa and Associated Disorders: www.anad.org National Eating Disorders Association: www.nationaleatingdisorders.org Eating Disorders Anonymous: www.eatingdisordersanonymous.org Eating Disorders Resource Center: AmericasFunny.chwww.edrcsv.org Document Released: 01/06/2005 Document Revised: 03/31/2011 Document Reviewed: 08/08/2010 [facility] Patient Information 2014 [place], [place].

## 2013-04-20 NOTE — ED Provider Notes (Signed)
CSN: [id number]     Arrival date & time 04/20/13  1220 History   First MD Initiated Contact with Patient 04/20/13 1243     Chief Complaint  Patient presents with  . Extremity Weakness  . Dehydration    HPI Pt has an eating disorder and had a relapse over the last several months.  She has been purging one or twice/daily several times per week but not every day.  She is not currently using any laxatives.  She has not been eating well.   Her symptoms have been increasing over the last several months.  She feels dehydrated and weak.  Even when she tries to eat she is having trouble with vomiting.  No fever.  She does have abdominal pain.  She does have pain in her arms and legs which she attributes to her fibromyalgia.  She saw her doctor who sent her to the ED.  Past Medical History  Diagnosis Date  . Asthma   . Anorexia    Past Surgical History  Procedure Laterality Date  . Tympanostomy tube placement     History reviewed. No pertinent family history. History  Substance Use Topics  . Smoking status: Never Smoker   . Smokeless tobacco: Not on file  . Alcohol Use: No   OB History   Grav Para Term Preterm Abortions TAB SAB Ect Mult Living                 Review of Systems  Constitutional: Positive for fatigue. Negative for fever.  Gastrointestinal: Positive for vomiting and abdominal pain.  Endocrine: Negative for polyuria.  Genitourinary: Negative for dysuria.  Neurological: Positive for weakness. Negative for headaches.      Allergies  Review of patient's allergies indicates no known allergies.  Home Medications   Current Outpatient Rx  Name  Route  Sig  Dispense  Refill  . clonazePAM (KLONOPIN) 0.5 MG tablet   Oral   Take 0.5 mg by mouth 3 (three) times daily as needed for anxiety.         Marland Kitchen levonorgestrel (MIRENA) 20 MCG/24HR IUD   Intrauterine   1 each by Intrauterine route once.         . Milnacipran HCl (SAVELLA PO)   Oral   Take 100 mg by mouth 2 (two)  times daily.          Marland Kitchen OVER THE COUNTER MEDICATION   Oral   Take 2 tablets by mouth once. Back and Body NSAID         . perphenazine (TRILAFON) 2 MG tablet   Oral   Take 2 mg by mouth 3 (three) times daily as needed.         . topiramate (TOPAMAX) 50 MG tablet   Oral   Take 50 mg by mouth daily as needed (Headache).          . traZODone (DESYREL) 100 MG tablet   Oral   Take 300 mg by mouth 3 (three) times daily.           BP 125/82  Pulse 120  Temp(Src) 97.8 F (36.6 C) (Oral)  Resp 20  SpO2 100% Physical Exam  Nursing note and vitals reviewed. Constitutional: No distress.  underweight  HENT:  Head: Normocephalic and atraumatic.  Right Ear: External ear normal.  Left Ear: External ear normal.  Eyes: Conjunctivae are normal. Right eye exhibits no discharge. Left eye exhibits no discharge. No scleral icterus.  Neck: Neck supple. No tracheal  deviation present.  Cardiovascular: Normal rate, regular rhythm and intact distal pulses.   Pulmonary/Chest: Effort normal and breath sounds normal. No stridor. No respiratory distress. She has no wheezes. She has no rales.  Abdominal: Soft. Bowel sounds are normal. She exhibits no distension. There is no tenderness. There is no rebound and no guarding.  Musculoskeletal: She exhibits no edema and no tenderness.  Neurological: She is alert. She has normal strength. No cranial nerve deficit (no facial droop, extraocular movements intact, no slurred speech) or sensory deficit. She exhibits normal muscle tone. She displays no seizure activity. Coordination normal.  Skin: Skin is warm and dry. No rash noted.  Psychiatric: She has a normal mood and affect.    ED Course  Procedures (including critical care time) Labs Review Labs Reviewed  COMPREHENSIVE METABOLIC PANEL - Abnormal; Notable for the following:    Sodium 135 (*)    Potassium 3.5 (*)    All other components within normal limits  CBC WITH DIFFERENTIAL  LIPASE, BLOOD   ETHANOL  URINALYSIS, ROUTINE W REFLEX MICROSCOPIC  PREGNANCY, URINE  URINE RAPID DRUG SCREEN (HOSP PERFORMED)   Medications  0.9 %  sodium chloride infusion (1,000 mLs Intravenous New Bag/Given 04/20/13 1325)    Followed by  0.9 %  sodium chloride infusion (1,000 mLs Intravenous New Bag/Given 04/20/13 1326)    Followed by  0.9 %  sodium chloride infusion (not administered)  ondansetron (ZOFRAN) injection 4 mg (4 mg Intravenous Given 04/20/13 1325)    MDM   Final diagnoses:  Eating disorder    Pt given IV fluids.  HR improved.  Mild electrolyte abnormalities but no severe dehydration requiring hospitalization.  Pt has a therapist and PCP. Pt is not interested treatment at this time (she does not want to go to [facility]).  Stable for discharge   Celene KrasJon R Aneudy Champlain, MD 04/20/13 1534

## 2013-04-20 NOTE — ED Notes (Signed)
Pt states she has been purging.  Pt has relapsed for the last 3 months.  Pt has had weakness, generalized aches, fatigue and feelings of dehydration.

## 2013-05-19 ENCOUNTER — Emergency Department (HOSPITAL_COMMUNITY)
Admission: EM | Admit: 2013-05-19 | Discharge: 2013-05-19 | Disposition: A | Discharge status: Home / Self Care | Payer: BC Managed Care – PPO | Attending: Emergency Medicine | Admitting: Emergency Medicine

## 2013-05-19 ENCOUNTER — Emergency Department (HOSPITAL_COMMUNITY): Payer: BC Managed Care – PPO

## 2013-05-19 ENCOUNTER — Encounter (HOSPITAL_COMMUNITY): Payer: Self-pay | Admitting: Emergency Medicine

## 2013-05-19 DIAGNOSIS — G8911 Acute pain due to trauma: Secondary | ICD-10-CM | POA: Insufficient documentation

## 2013-05-19 DIAGNOSIS — Z3202 Encounter for pregnancy test, result negative: Secondary | ICD-10-CM | POA: Insufficient documentation

## 2013-05-19 DIAGNOSIS — Z79899 Other long term (current) drug therapy: Secondary | ICD-10-CM | POA: Insufficient documentation

## 2013-05-19 DIAGNOSIS — R5383 Other fatigue: Secondary | ICD-10-CM

## 2013-05-19 DIAGNOSIS — R42 Dizziness and giddiness: Secondary | ICD-10-CM | POA: Insufficient documentation

## 2013-05-19 DIAGNOSIS — G40909 Epilepsy, unspecified, not intractable, without status epilepticus: Secondary | ICD-10-CM | POA: Insufficient documentation

## 2013-05-19 DIAGNOSIS — R569 Unspecified convulsions: Secondary | ICD-10-CM

## 2013-05-19 DIAGNOSIS — M542 Cervicalgia: Secondary | ICD-10-CM | POA: Insufficient documentation

## 2013-05-19 DIAGNOSIS — J45909 Unspecified asthma, uncomplicated: Secondary | ICD-10-CM | POA: Insufficient documentation

## 2013-05-19 DIAGNOSIS — R5381 Other malaise: Secondary | ICD-10-CM | POA: Insufficient documentation

## 2013-05-19 LAB — BASIC METABOLIC PANEL
BUN: 9 mg/dL (ref 6–23)
CHLORIDE: 102 meq/L (ref 96–112)
CO2: 23 mEq/L (ref 19–32)
Calcium: 9.3 mg/dL (ref 8.4–10.5)
Creatinine, Ser: 0.6 mg/dL (ref 0.50–1.10)
Glucose, Bld: 83 mg/dL (ref 70–99)
Potassium: 4.2 mEq/L (ref 3.7–5.3)
SODIUM: 138 meq/L (ref 137–147)

## 2013-05-19 LAB — CBC
HEMATOCRIT: 39.2 % (ref 36.0–46.0)
Hemoglobin: 13.3 g/dL (ref 12.0–15.0)
MCH: 29.4 pg (ref 26.0–34.0)
MCHC: 33.9 g/dL (ref 30.0–36.0)
MCV: 86.5 fL (ref 78.0–100.0)
Platelets: 254 10*3/uL (ref 150–400)
RBC: 4.53 MIL/uL (ref 3.87–5.11)
RDW: 15 % (ref 11.5–15.5)
WBC: 4.6 10*3/uL (ref 4.0–10.5)

## 2013-05-19 LAB — URINALYSIS, ROUTINE W REFLEX MICROSCOPIC
Bilirubin Urine: NEGATIVE
GLUCOSE, UA: NEGATIVE mg/dL
HGB URINE DIPSTICK: NEGATIVE
Ketones, ur: NEGATIVE mg/dL
LEUKOCYTES UA: NEGATIVE
Nitrite: NEGATIVE
PH: 5.5 (ref 5.0–8.0)
Protein, ur: NEGATIVE mg/dL
SPECIFIC GRAVITY, URINE: 1.014 (ref 1.005–1.030)
Urobilinogen, UA: 1 mg/dL (ref 0.0–1.0)

## 2013-05-19 MED ORDER — LORAZEPAM 1 MG PO TABS: 1.0000 mg | ORAL_TABLET | Freq: Four times a day (QID) | ORAL | Status: DC | PRN

## 2013-05-19 NOTE — ED Notes (Signed)
Pt refused vital signs and threw discharge paperwork in the trash on her way out. Pt sts she is angry because she didn't get IV fluids and no one has helped or addressed her problem. RN asked if pt. Would like to speak with doctor and pt. Declined.
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
## 2013-05-19 NOTE — ED Notes (Signed)
Patient transported to Radiology 

## 2013-05-19 NOTE — Discharge Instructions (Signed)
Seizure, Adult A seizure is abnormal electrical activity in the brain. Seizures usually last from 30 seconds to 2 minutes. There are various types of seizures. Before a seizure, you may have a warning sensation (aura) that a seizure is about to occur. An aura may include the following symptoms:   Fear or anxiety.  Nausea.  Feeling like the room is spinning (vertigo).  Vision changes, such as seeing flashing lights or spots. Common symptoms during a seizure include:  A change in attention or behavior (altered mental status).  Convulsions with rhythmic jerking movements.  Drooling.  Rapid eye movements.  Grunting.  Loss of bladder and bowel control.  Bitter taste in the mouth.  Tongue biting. After a seizure, you may feel confused and sleepy. You may also have an injury resulting from convulsions during the seizure. HOME CARE INSTRUCTIONS   If you are given medicines, take them exactly as prescribed by your health care provider.  Keep all follow-up appointments as directed by your health care provider.  Do not swim or drive or engage in risky activity during which a seizure could cause further injury to you or others until your health care provider says it is OK.  Get adequate rest.  Teach friends and family what to do if you have a seizure. They should:  Lay you on the ground to prevent a fall.  Put a cushion under your head.  Loosen any tight clothing around your neck.  Turn you on your side. If vomiting occurs, this helps keep your airway clear.  Stay with you until you recover.  Know whether or not you need emergency care. SEEK IMMEDIATE MEDICAL CARE IF:  The seizure lasts longer than 5 minutes.  The seizure is severe or you do not wake up immediately after the seizure.  You have an altered mental status after the seizure.  You are having more frequent or worsening seizures. Someone should drive you to the emergency department or call local emergency  services (911 in [place] MAKE SURE YOU:  Understand these instructions.  Will watch your condition.  Will get help right away if you are not doing well or get worse. Document Released: 01/04/2000 Document Revised: 10/27/2012 Document Reviewed: 08/18/2012 [facility] Patient Information 2014 [place], [place].  Nonepileptic Seizures Nonepileptic seizures look like true epileptic seizures. The difference between nonepileptic seizures and real seizures is that real seizures are caused by an electrical abnormality in the brain. Nonepileptic seizures have no medical cause. Nonepileptic seizures may look real to an untrained person. A neurologist can usually tell the difference between a real seizure and a nonepileptic seizure. Nonepileptic seizures may also be called pseudoseizures. They are more frequent in women. CAUSES  In general, the patient is unaware that the movements are not real seizures. This disorder is caused by stress or emotional trauma. Patients often feel badly. Patients are sometimes accused of causing the seizure-like movements when they are not aware that their symptoms are due to stress. The nonepileptic seizures are real and frightening to patients with this disorder. Sometimes, nonepileptic seizures may be due to a person faking the symptoms to get something he or she wants. DIAGNOSIS  The diagnosis requires the patient to be continuously monitored by:  EEG (electroencephalogram).  Video camera. After an episode, the patient is asked about their awareness, memory, and feelings during the seizure. The family, if present, also discusses what they see. The EEG and clinical information allows the neurologist to determine if the seizures are related to  abnormal electrical activity in the brain. TREATMENT  Medicines may be stopped if the patient has been treated for a true seizure disorder. Patient counseling is usually begun. Depression and anxiety, if present, are treated. Counseling  helps to resolve stress.  Document Released: 02/21/2005 Document Revised: 03/31/2011 Document Reviewed: 07/20/2008 [facility] Patient Information 2014 [place], [place].

## 2013-05-19 NOTE — ED Notes (Signed)
Per pt, sent here for "seizure like activity"-started last week-states they happen at night

## 2013-05-19 NOTE — ED Notes (Addendum)
This RN was called to lobby to speak w/ Pt.  Pt was upset w/ care.  Pt reported "that this was the worst care that she has ever received."  She did not believe that her was complaint was addressed and was upset that she did not receive IV fluids.  This RN asked about blood work and Pt reported that she was told it was all normal.  Pt, also, had imaging completed that resulted normal.  Pt upset that she only saw the MD once and "no one ever came to check on her."  This RN explained that it is our job to rule out emergency conditions.  This RN apologized that she was unhappy w/ her care and wished that she had voiced her concern prior to d/c.  This RN reassured Pt that I would talk to the RN and my superior about the situation.  When this RN asked if the Pt was referred for follow up, the Pt stated that it was on her d/c paperwork, but she had thrown it away out of anger.  This RN offered to reprint paperwork and Pt agree.  As I turned to open doors to the unit, the Pt threw up her hands, yelled "nevermind," and stormed out.  The Pt continued to scream as she walked out of the department.

## 2013-06-02 ENCOUNTER — Encounter: Payer: Self-pay | Admitting: Physician Assistant

## 2013-06-02 ENCOUNTER — Ambulatory Visit (INDEPENDENT_AMBULATORY_CARE_PROVIDER_SITE_OTHER): Payer: BC Managed Care – PPO | Admitting: Physician Assistant

## 2013-06-02 VITALS — BP 117/76 | HR 70 | Temp 98.2°F | Resp 16 | Ht 64.5 in

## 2013-06-02 DIAGNOSIS — F509 Eating disorder, unspecified: Secondary | ICD-10-CM | POA: Insufficient documentation

## 2013-06-02 DIAGNOSIS — F32A Depression, unspecified: Secondary | ICD-10-CM | POA: Insufficient documentation

## 2013-06-02 DIAGNOSIS — F329 Major depressive disorder, single episode, unspecified: Secondary | ICD-10-CM | POA: Insufficient documentation

## 2013-06-02 DIAGNOSIS — Z7289 Other problems related to lifestyle: Secondary | ICD-10-CM | POA: Insufficient documentation

## 2013-06-02 DIAGNOSIS — J029 Acute pharyngitis, unspecified: Secondary | ICD-10-CM

## 2013-06-02 DIAGNOSIS — G894 Chronic pain syndrome: Secondary | ICD-10-CM | POA: Insufficient documentation

## 2013-06-02 DIAGNOSIS — F419 Anxiety disorder, unspecified: Secondary | ICD-10-CM

## 2013-06-02 DIAGNOSIS — R569 Unspecified convulsions: Secondary | ICD-10-CM | POA: Insufficient documentation

## 2013-06-02 DIAGNOSIS — M797 Fibromyalgia: Secondary | ICD-10-CM

## 2013-06-02 DIAGNOSIS — R443 Hallucinations, unspecified: Secondary | ICD-10-CM | POA: Insufficient documentation

## 2013-06-02 DIAGNOSIS — Z0000 Encounter for general adult medical examination without abnormal findings: Secondary | ICD-10-CM

## 2013-06-02 LAB — TSH: TSH: 0.785 u[IU]/mL (ref 0.350–4.500)

## 2013-06-02 LAB — BASIC METABOLIC PANEL
BUN: 10 mg/dL (ref 6–23)
CHLORIDE: 102 meq/L (ref 96–112)
CO2: 28 meq/L (ref 19–32)
Calcium: 9.4 mg/dL (ref 8.4–10.5)
Creat: 0.55 mg/dL (ref 0.50–1.10)
GLUCOSE: 86 mg/dL (ref 70–99)
POTASSIUM: 4 meq/L (ref 3.5–5.3)
SODIUM: 140 meq/L (ref 135–145)

## 2013-06-02 LAB — MAGNESIUM: Magnesium: 1.7 mg/dL (ref 1.5–2.5)

## 2013-06-02 LAB — PHOSPHORUS: Phosphorus: 4 mg/dL (ref 2.3–4.6)

## 2013-06-02 MED ORDER — MAGIC MOUTHWASH W/LIDOCAINE: 10.0000 mL | ORAL | Status: DC | PRN

## 2013-06-02 NOTE — Progress Notes (Signed)
I have examined this patient along with the student and agree.  

## 2013-06-02 NOTE — Progress Notes (Signed)
Subjective:    Patient ID: Terry Schneider, female    DOB: 1990/05/20, 23 y.o.   MRN: [id number]  HPI  23y.o here for annual physical exam and would like to establish a PCP.    Prior to Admission medications   Medication Sig Start Date End Date Taking? Authorizing Provider  clonazePAM (KLONOPIN) 0.5 MG tablet Take 0.5 mg by mouth 3 (three) times daily as needed for anxiety.   Yes Historical Provider, MD  gabapentin (NEURONTIN) 600 MG tablet Take 1,200 mg by mouth 3 (three) times daily.   Yes Historical Provider, MD  Levomilnacipran HCl (FETZIMA PO) Take 120 mg by mouth at bedtime.    Yes Historical Provider, MD  levonorgestrel (MIRENA) 20 MCG/24HR IUD 1 each by Intrauterine route once.   Yes Historical Provider, MD  perphenazine (TRILAFON) 4 MG tablet Take 4 mg by mouth 3 (three) times daily.   Yes Historical Provider, MD  traZODone (DESYREL) 100 MG tablet Take 100-300 mg by mouth at bedtime.    Yes Historical Provider, MD  Alum & Mag Hydroxide-Simeth (MAGIC MOUTHWASH W/LIDOCAINE) SOLN Take 10 mLs by mouth every 2 (two) hours as needed for mouth pain. 06/02/13   Fernande Brashelle S Jeffery, PA-C   Patient Active Problem List   Diagnosis Date Noted  . Eating disorder 06/02/2013  . 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 Alcohol abuse Father    History   Social History  . Marital Status: Single    Spouse Name: n/a    Number of Children: 0  . Years of Education: N/A   Occupational History  . nanny   . tutor    Social History Main Topics  . Smoking status: Never Smoker   . Smokeless tobacco: Not on file  . Alcohol Use: No  . Drug Use: No  . Sexual Activity: Yes    Birth Control/ Protection: IUD   Other Topics Concern  . Not on file   Social History Narrative   Lives with her mother, but is in the process of moving in with her boyfriend.   No contact with her father since 2013.      She has been struggling with bulemia and anorexia since  she was 23y.o.  She does not binge, but does purge.  She has been hospitalized about 18 times.  At her worst she was purging 10 x per day and at best she recently went 1 year without throwing up.  Her last purge was 2 days ago.  She recently relapsed with purging at the end of March and into April.  She was seen in the ED on 4/1 and 4/30 for possible seizure activity.  She was restrictng foods and fluids and was falling and passing out during the relapse.  She now sees dentist regularly.  Last inpatient treatment was in 12/2011 for 90 days.  Also sees psychiatrist for visual and auditory hallucinations and depression/anxiety.  3 years ago she used to cut her wrists with panic attacks and increasing anxiety.  Medication has controlled this habit.  However 10 days ago she cut her stomach because she had an anxiety attack and forgot to take her medication.  She states she does not have any thoughts of self mutilation when taking her medicine as prescribed.  She is happy with her psychiatrist and feels like it is very helpful.  Also, 2 days ago she choked on a fried mozzeralla stick that she heated in the microwave.  The mozzarella stick burned her thorat which caused the choking.  She was able to swallow it, but she purged shortly after.  Her vomit was bloody and she continued to cough up blood for the remainder of the day.  She also described a feeling of a piece of the food still stuck in her throat for hours after.  Today her throat is still very painful.  She is no longer has hemoptysis.  Tylenol and aleve do not offer any relief.  Throat pain is keeping her from eating and drinking.  Letter from dietician gives her most recent recorded weight of 121 with goal of 125.  Review of Systems  Constitutional: Negative for fever, chills, diaphoresis and appetite change.  HENT: Positive for sore throat. Negative for postnasal drip.   Eyes: Negative.   Respiratory: Negative for wheezing.   Cardiovascular:  Negative for chest pain and leg swelling.  Gastrointestinal: Negative for nausea and abdominal distention.  Endocrine: Negative.   Genitourinary: Negative for dysuria, urgency and frequency.  Musculoskeletal: Positive for arthralgias and back pain.  Skin: Negative.  Negative for rash.  Neurological: Positive for seizures (evaluated in ED on 4/1 and 4/30). Negative for dizziness, weakness and light-headedness.       Objective:   Physical Exam  Constitutional: She is oriented to person, place, and time. No distress.  BP 117/76  Pulse 70  Temp(Src) 98.2 F (36.8 C)  Resp 16  Ht 5' 4.5" (1.638 m)  SpO2 100%   HENT:  Head: Normocephalic.  Right Ear: Tympanic membrane normal.  Left Ear: Tympanic membrane normal.  Nose: Nose normal.  Mouth/Throat: Uvula is midline. Posterior oropharyngeal erythema present. No oropharyngeal exudate or posterior oropharyngeal edema.  Eyes: Conjunctivae are normal. Pupils are equal, round, and reactive to light.  Neck: Normal range of motion. No thyromegaly present.  Cardiovascular: Normal rate, normal heart sounds and intact distal pulses.  Exam reveals no gallop and no friction rub.   No murmur heard. Pulmonary/Chest: Effort normal and breath sounds normal. No respiratory distress. She has no wheezes. She exhibits no tenderness.  Abdominal: Soft. Bowel sounds are normal. She exhibits no distension and no mass. There is no tenderness.  Musculoskeletal: Normal range of motion. She exhibits no edema.  Lymphadenopathy:    She has no cervical adenopathy.  Neurological: She is alert and oriented to person, place, and time. She has normal strength and normal reflexes. She displays no tremor. No cranial nerve deficit or sensory deficit. She exhibits normal muscle tone. Gait normal.  Skin:  Numerous scars to wrists bilat.  2 well healed cuts on her lower mid abd          Assessment & Plan:   1. Annual physical exam Age appropriate health guidance  provided.  GYN care is provided elsewhere.    2. Eating disorder Continue visits with Nutritionist, therapist, and psychiatrist per their recommendations.  Await lab results.  Will need repeat DEXA in December.  Consider estrogen containing product (currently using progestin only IUD) - Basic metabolic panel - TSH - Phosphorus - Magnesium  3. Acute pharyngitis Pt will return if symptoms worsen. - Alum & Mag Hydroxide-Simeth (MAGIC MOUTHWASH W/LIDOCAINE) SOLN; Take 10 mLs by mouth every 2 (two) hours as needed for mouth pain.  Dispense: 360 mL; Refill: 0  4. Self-mutilation No plans for additional self-mutilation at this time

## 2013-06-02 NOTE — Patient Instructions (Signed)
I will contact you with your lab results as soon as they are available.   If you have not heard from me in 2 weeks, please contact me.  The fastest way to get your results is to register for My Chart (see the instructions on the last page of this printout).  Keeping You Healthy  Get These Tests 1. Blood Pressure- Have your blood pressure checked once a year by your health care provider.  Normal blood pressure is 120/80. 2. Weight- Have your body mass index (BMI) calculated to screen for obesity.  BMI is measure of body fat based on height and weight.  You can also calculate your own BMI at www.nhlbisupport.com/bmi/. 3. Cholesterol- Have your cholesterol checked every 5 years starting at age 20 then yearly starting at age 45. 4. Chlamydia, HIV, and other sexually transmitted diseases- Get screened every year until age 25, then within three months of each new sexual provider. 5. Pap Smear- Every 1-3 years; discuss with your health care provider. 6. Mammogram- Every year starting at age 40  Take these medicines  Calcium with Vitamin D-Your body needs 1200 mg of Calcium each day and 800-1000 IU of Vitamin D daily.  Your body can only absorb 500 mg of Calcium at a time so Calcium must be taken in 2 or 3 divided doses throughout the day.  Multivitamin with folic acid- Once daily if it is possible for you to become pregnant.  Get these Immunizations  Gardasil-Series of three doses; prevents HPV related illness such as genital warts and cervical cancer.  Menactra-Single dose; prevents meningitis.  Tetanus shot- Every 10 years.  Flu shot-Every year.  Take these steps 1. Do not smoke-Your healthcare provider can help you quit.  For tips on how to quit go to www.smokefree.gov or call 1-800 QUITNOW. 2. Be physically active- Exercise 5 days a week for at least 30 minutes.  If you are not already physically active, start slow and gradually work up to 30 minutes of moderate physical activity.   Examples of moderate activity include walking briskly, dancing, swimming, bicycling, etc. 3. Breast Cancer- A self breast exam every month is important for early detection of breast cancer.  For more information and instruction on self breast exams, ask your healthcare provider or www.womenshealth.gov/faq/breast-self-exam.cfm. 4. Eat a healthy diet- Eat a variety of healthy foods such as fruits, vegetables, whole grains, low fat milk, low fat cheeses, yogurt, lean meats, poultry and fish, beans, nuts, tofu, etc.  For more information go to www. Thenutritionsource.org 5. Drink alcohol in moderation- Limit alcohol intake to one drink or less per day. Never drink and drive. 6. Depression- Your emotional health is as important as your physical health.  If you're feeling down or losing interest in things you normally enjoy please talk to your healthcare provider about being screened for depression. 7. Dental visit- Brush and floss your teeth twice daily; visit your dentist twice a year. 8. Eye doctor- Get an eye exam at least every 2 years. 9. Helmet use- Always wear a helmet when riding a bicycle, motorcycle, rollerblading or skateboarding. 10. Safe sex- If you may be exposed to sexually transmitted infections, use a condom. 11. Seat belts- Seat belts can save your live; always wear one. 12. Smoke/Carbon Monoxide detectors- These detectors need to be installed on the appropriate level of your home. Replace batteries at least once a year. 13. Skin cancer- When out in the sun please cover up and use sunscreen 15 SPF or higher.   14. Violence- If anyone is threatening or hurting you, please tell your healthcare provider.        

## 2013-06-06 ENCOUNTER — Encounter: Payer: Self-pay | Admitting: Physician Assistant

## 2013-06-14 ENCOUNTER — Ambulatory Visit: Payer: BC Managed Care – PPO

## 2013-06-14 ENCOUNTER — Ambulatory Visit (HOSPITAL_COMMUNITY)
Admission: RE | Admit: 2013-06-14 | Discharge: 2013-06-14 | Disposition: A | Discharge status: Home / Self Care | Payer: BC Managed Care – PPO | Source: Ambulatory Visit | Attending: Internal Medicine | Admitting: Internal Medicine

## 2013-06-14 ENCOUNTER — Ambulatory Visit (INDEPENDENT_AMBULATORY_CARE_PROVIDER_SITE_OTHER): Payer: BC Managed Care – PPO | Admitting: Family Medicine

## 2013-06-14 VITALS — BP 112/72 | HR 85 | Temp 98.2°F | Resp 16 | Ht 64.0 in | Wt 121.0 lb

## 2013-06-14 DIAGNOSIS — R51 Headache: Secondary | ICD-10-CM | POA: Diagnosis present

## 2013-06-14 DIAGNOSIS — R110 Nausea: Secondary | ICD-10-CM | POA: Diagnosis not present

## 2013-06-14 DIAGNOSIS — M542 Cervicalgia: Secondary | ICD-10-CM

## 2013-06-14 DIAGNOSIS — S0990XA Unspecified injury of head, initial encounter: Secondary | ICD-10-CM

## 2013-06-14 MED ORDER — CYCLOBENZAPRINE HCL 10 MG PO TABS: 10.0000 mg | ORAL_TABLET | Freq: Every evening | ORAL | Status: DC | PRN

## 2013-06-14 NOTE — Patient Instructions (Addendum)
[facility]--go to Radiology department from [employer]. CT Head without contrast  Prior Auth [id number]          Concussion, Adult A concussion, or closed-head injury, is a brain injury caused by a direct blow to the head or by a quick and sudden movement (jolt) of the head or neck. Concussions are usually not life-threatening. Even so, the effects of a concussion can be serious. If you have had a concussion before, you are more likely to experience concussion-like symptoms after a direct blow to the head.  CAUSES   Direct blow to the head, such as from running into another player during a soccer game, being hit in a fight, or hitting your head on a hard surface.  A jolt of the head or neck that causes the brain to move back and forth inside the skull, such as in a car crash. SIGNS AND SYMPTOMS  The signs of a concussion can be hard to notice. Early on, they may be missed by you, family members, and health care providers. You may look fine but act or feel differently. Symptoms are usually temporary, but they may last for days, weeks, or even longer. Some symptoms may appear right away while others may not show up for hours or days. Every head injury is different. Symptoms include:   Mild to moderate headaches that will not go away.  A feeling of pressure inside your head.  Having more trouble than usual:   Learning or remembering things you have heard.  Answering questions.  Paying attention or concentrating.   Organizing daily tasks.   Making decisions and solving problems.   Slowness in thinking, acting or reacting, speaking, or reading.   Getting lost or being easily confused.   Feeling tired all the time or lacking energy (fatigued).   Feeling drowsy.   Sleep disturbances.   Sleeping more than usual.   Sleeping less than usual.   Trouble falling asleep.   Trouble sleeping (insomnia).   Loss of balance or feeling lightheaded or  dizzy.   Nausea or vomiting.   Numbness or tingling.   Increased sensitivity to:   Sounds.   Lights.   Distractions.   Vision problems or eyes that tire easily.   Diminished sense of taste or smell.   Ringing in the ears.   Mood changes such as feeling sad or anxious.   Becoming easily irritated or angry for little or no reason.   Lack of motivation.  Seeing or hearing things other people do not see or hear (hallucinations). DIAGNOSIS  Your health care provider can usually diagnose a concussion based on a description of your injury and symptoms. He or she will ask whether you passed out (lost consciousness) and whether you are having trouble remembering events that happened right before and during your injury.  Your evaluation might include:   A brain scan to look for signs of injury to the brain. Even if the test shows no injury, you may still have a concussion.   Blood tests to be sure other problems are not present. TREATMENT   Concussions are usually treated in an emergency department, in urgent care, or at a clinic. You may need to stay in the hospital overnight for further treatment.   Tell your health care provider if you are taking any medicines, including prescription medicines, over-the-counter medicines, and natural remedies. Some medicines, such as blood thinners (anticoagulants) and aspirin, may increase the chance of complications. Also tell your  health care provider whether you have had alcohol or are taking illegal drugs. This information may affect treatment.  Your health care provider will send you home with important instructions to follow.  How fast you will recover from a concussion depends on many factors. These factors include how severe your concussion is, what part of your brain was injured, your age, and how healthy you were before the concussion.  Most people with mild injuries recover fully. Recovery can take time. In general,  recovery is slower in older persons. Also, persons who have had a concussion in the past or have other medical problems may find that it takes longer to recover from their current injury. HOME CARE INSTRUCTIONS  General Instructions  Carefully follow the directions your health care provider gave you.  Only take over-the-counter or prescription medicines for pain, discomfort, or fever as directed by your health care provider.  Take only those medicines that your health care provider has approved.  Do not drink alcohol until your health care provider says you are well enough to do so. Alcohol and certain other drugs may slow your recovery and can put you at risk of further injury.  If it is harder than usual to remember things, write them down.  If you are easily distracted, try to do one thing at a time. For example, do not try to watch TV while fixing dinner.  Talk with family members or close friends when making important decisions.  Keep all follow-up appointments. Repeated evaluation of your symptoms is recommended for your recovery.  Watch your symptoms and tell others to do the same. Complications sometimes occur after a concussion. Older adults with a brain injury may have a higher risk of serious complications such as of a blood clot on the brain.  Tell your teachers, school nurse, school counselor, coach, athletic trainer, or work Production designer, theatre/television/film about your injury, symptoms, and restrictions. Tell them about what you can or cannot do. They should watch for:   Increased problems with attention or concentration.   Increased difficulty remembering or learning new information.   Increased time needed to complete tasks or assignments.   Increased irritability or decreased ability to cope with stress.   Increased symptoms.   Rest. Rest helps the brain to heal. Make sure you:  Get plenty of sleep at night. Avoid staying up late at night.  Keep the same bedtime hours on weekends  and weekdays.  Rest during the day. Take daytime naps or rest breaks when you feel tired.  Limit activities that require a lot of thought or concentration. These includes   Doing homework or job-related work.   Watching TV.   Working on the computer.  Avoid any situation where there is potential for another head injury (football, hockey, soccer, basketball, martial arts, downhill snow sports and horseback riding). Your condition will get worse every time you experience a concussion. You should avoid these activities until you are evaluated by the appropriate follow-up caregivers. Returning To Your Regular Activities You will need to return to your normal activities slowly, not all at once. You must give your body and brain enough time for recovery.  Do not return to sports or other athletic activities until your health care provider tells you it is safe to do so.  Ask your health care provider when you can drive, ride a bicycle, or operate heavy machinery. Your ability to react may be slower after a brain injury. Never do these activities if you are  dizzy.  Ask your health care provider about when you can return to work or school. Preventing Another Concussion It is very important to avoid another brain injury, especially before you have recovered. In rare cases, another injury can lead to permanent brain damage, brain swelling, or death. The risk of this is greatest during the first 7 10 days after a head injury. Avoid injuries by:   Wearing a seat belt when riding in a car.   Drinking alcohol only in moderation.   Wearing a helmet when biking, skiing, skateboarding, skating, or doing similar activities.  Avoiding activities that could lead to a second concussion, such as contact or recreational sports, until your health care provider says it is OK.  Taking safety measures in your home.   Remove clutter and tripping hazards from floors and stairways.   Use grab bars in  bathrooms and handrails by stairs.   Place non-slip mats on floors and in bathtubs.   Improve lighting in dim areas. SEEK MEDICAL CARE IF:   You have increased problems paying attention or concentrating.   You have increased difficulty remembering or learning new information.   You need more time to complete tasks or assignments than before.   You have increased irritability or decreased ability to cope with stress.  You have more symptoms than before. Seek medical care if you have any of the following symptoms for more than 2 weeks after your injury:   Lasting (chronic) headaches.   Dizziness or balance problems.   Nausea.  Vision problems.   Increased sensitivity to noise or light.   Depression or mood swings.   Anxiety or irritability.   Memory problems.   Difficulty concentrating or paying attention.   Sleep problems.   Feeling tired all the time. SEEK IMMEDIATE MEDICAL CARE IF:   You have severe or worsening headaches. These may be a sign of a blood clot in the brain.  You have weakness (even if only in one hand, leg, or part of the face).  You have numbness.  You have decreased coordination.   You vomit repeatedly.  You have increased sleepiness.  One pupil is larger than the other.   You have convulsions.   You have slurred speech.   You have increased confusion. This may be a sign of a blood clot in the brain.  You have increased restlessness, agitation, or irritability.   You are unable to recognize people or places.   You have neck pain.   It is difficult to wake you up.   You have unusual behavior changes.   You lose consciousness. MAKE SURE YOU:   Understand these instructions.  Will watch your condition.  Will get help right away if you are not doing well or get worse. Document Released: 03/29/2003 Document Revised: 09/08/2012 Document Reviewed: 07/29/2012 [facility] Patient Information 2014 [employer] [employer],  [place].
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
## 2013-06-15 ENCOUNTER — Encounter: Payer: Self-pay | Admitting: Physical Medicine & Rehabilitation

## 2013-06-15 NOTE — Progress Notes (Signed)
Xray read and patient discussed with Ms. Gessner. Agree with assessment and plan of care per her note.   

## 2013-07-09 ENCOUNTER — Emergency Department (HOSPITAL_COMMUNITY)
Admission: EM | Admit: 2013-07-09 | Discharge: 2013-07-09 | Disposition: A | Discharge status: Home / Self Care | Payer: BC Managed Care – PPO | Attending: Emergency Medicine | Admitting: Emergency Medicine

## 2013-07-09 ENCOUNTER — Encounter (HOSPITAL_COMMUNITY): Payer: Self-pay | Admitting: Emergency Medicine

## 2013-07-09 ENCOUNTER — Emergency Department (HOSPITAL_COMMUNITY): Payer: BC Managed Care – PPO

## 2013-07-09 DIAGNOSIS — J45909 Unspecified asthma, uncomplicated: Secondary | ICD-10-CM | POA: Insufficient documentation

## 2013-07-09 DIAGNOSIS — M25531 Pain in right wrist: Secondary | ICD-10-CM

## 2013-07-09 DIAGNOSIS — X500XXA Overexertion from strenuous movement or load, initial encounter: Secondary | ICD-10-CM | POA: Insufficient documentation

## 2013-07-09 DIAGNOSIS — Y9389 Activity, other specified: Secondary | ICD-10-CM | POA: Insufficient documentation

## 2013-07-09 DIAGNOSIS — Y9289 Other specified places as the place of occurrence of the external cause: Secondary | ICD-10-CM | POA: Insufficient documentation

## 2013-07-09 DIAGNOSIS — S59909A Unspecified injury of unspecified elbow, initial encounter: Secondary | ICD-10-CM | POA: Insufficient documentation

## 2013-07-09 DIAGNOSIS — M797 Fibromyalgia: Secondary | ICD-10-CM

## 2013-07-09 DIAGNOSIS — S6990XA Unspecified injury of unspecified wrist, hand and finger(s), initial encounter: Secondary | ICD-10-CM | POA: Insufficient documentation

## 2013-07-09 DIAGNOSIS — S59919A Unspecified injury of unspecified forearm, initial encounter: Principal | ICD-10-CM

## 2013-07-09 HISTORY — DX: Fibromyalgia: M79.7

## 2013-07-09 MED ORDER — HYDROCODONE-ACETAMINOPHEN 5-325 MG PO TABS: 1.0000 | ORAL_TABLET | ORAL | Status: DC | PRN

## 2013-07-09 NOTE — ED Notes (Signed)
Patient transported to X-ray 

## 2013-07-09 NOTE — ED Provider Notes (Signed)
CSN: [id number]     Arrival date & time 07/09/13  2101 History   First MD Initiated Contact with Patient 07/09/13 2115     Chief Complaint  Patient presents with  . Arm Pain     (Consider location/radiation/quality/duration/timing/severity/associated sxs/prior Treatment) The history is provided by the patient.   Pt with hx fibromyalgia presents with right wrist pain x4 days.  Pt's significant other gives most of the history.  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   Had tingling across her knuckles that has resolved.  Pt took Lyrica for her pain - reports she took her normal dosage, no additional medications.  Denies fevers, weakness or numbness of the hand.    Past Medical History  Diagnosis Date  . Asthma   . Anorexia   . Fibromyalgia    Past Surgical History  Procedure Laterality Date  . Tympanostomy tube placement     Family History  Problem Relation Age of Onset  . Alcohol abuse Father    History  Substance Use Topics  . Smoking status: Never Smoker   . Smokeless tobacco: Not on file  . Alcohol Use: No   OB History   Grav Para Term Preterm Abortions TAB SAB Ect Mult Living                 Review of Systems  All other systems reviewed and are negative.     Allergies  Review of patient's allergies indicates no known allergies.  Home Medications   Prior to Admission medications   Medication Sig Start Date End Date Taking? Authorizing Provider  ibuprofen (ADVIL,MOTRIN) 200 MG tablet Take 400 mg by mouth every 6 (six) hours as needed for mild pain.   Yes Historical Provider, MD  levonorgestrel (MIRENA) 20 MCG/24HR IUD 1 each by Intrauterine route continuous.    Yes Historical Provider, MD  OVER THE COUNTER MEDICATION Take 1 capsule by mouth daily. Kratom Red. For pain or Anxiety   Yes Historical Provider, MD  pregabalin (LYRICA) 300 MG capsule Take 300 mg by mouth daily as needed. For  fibromyalgia   Yes Historical Provider, MD  traZODone (DESYREL) 100 MG tablet Take 100-300 mg by mouth at bedtime.    Yes Historical Provider, MD   BP 114/66  Pulse 78  Temp(Src) 97.6 F (36.4 C) (Oral)  Resp 18  SpO2 100% Physical Exam  Nursing note and vitals reviewed. Constitutional: She appears well-developed and well-nourished. No distress.  HENT:  Head: Normocephalic and atraumatic.  Neck: Neck supple.  Pulmonary/Chest: Effort normal.  Musculoskeletal:       Right elbow: Normal.      Right forearm: Normal.       Arms:      Right hand: Normal.  Right wrist with diffuse tenderness over dorsal aspect.  No focal tenderness. No crepitus.  No skin changes.  Full AROM of hand.  Pt does not move wrist secondary to pain.  Pain with passive ROM of wrist in all directions.  Capillary refill < 2 seconds throughout.  Radial pulse intact.    Neurological: She is alert. GCS eye subscore is 4. GCS verbal subscore is 5. GCS motor subscore is 6.  Skin: She is not diaphoretic.  Psychiatric: She is slowed.  Slowed speech.  Eyes are heavy-lidded.      ED Course  Procedures (including critical care time) Labs Review Labs Reviewed -  No data to display  Imaging Review Dg Wrist Complete Right  07/09/2013   CLINICAL DATA:  Injury to right wrist.  Right wrist pain.  EXAM: RIGHT WRIST - COMPLETE 3+ VIEW  COMPARISON:  None.  FINDINGS: There is no evidence of fracture or dislocation. The carpal rows are intact, and demonstrate normal alignment. The joint spaces are preserved. Negative ulnar variance is noted. A tiny osseous fragment adjacent to the ulnar styloid may reflect remote injury.  Mild dorsal soft tissue swelling is noted at the wrist.  IMPRESSION: No evidence of fracture or dislocation.   Electronically Signed   By: Roanna RaiderJeffery  Chang M.D.   On: 07/09/2013 22:14     EKG Interpretation None      MDM   Final diagnoses:  Right wrist pain  Fibromyalgia   Pt with hx fibromyalgia with right  wrist pain x 4 days.  Neurovascularly intact.  No e/o injury on exam.  No focal tenderness.   Xray negative.  Suspect strain vs new fibromyalgia pain (states she does have pain in this area from fibromyalgia but it is not usually this severe).  Pt requested pain medication for home - she states she is out of Lyrica and that it did not help her pain, despite coming into the ED groggy from it earlier.  She was less groggy and able to discuss this clearly with me at the end of her visit.  She also requested a sling and I ordered her an ice pack.  D/C home with #6 vicodin as pt has tramadol at home for pain.  I strongly cautioned patient about the various sedating medications she has at home and to be careful about using them, not to combine them.  Discussed result, findings, treatment, and follow up  with patient.  Pt given return precautions.  Pt verbalizes understanding and agrees with plan.       [place] HeartEmily [place], PA-C 07/10/13 [phone]

## 2013-07-09 NOTE — ED Notes (Signed)
Per SO report, pt initially hurt her right wrists last Wednesday.  Last night pt heard the wrist "pop" while she was lifting a pt at work.  Since that incident, wrist has become worse.

## 2013-07-09 NOTE — Discharge Instructions (Signed)
Read the information below.  Use the prescribed medication as directed.  Please discuss all new medications with your pharmacist.  Do not take additional tylenol while taking the prescribed pain medication to avoid overdose.  You may return to the Emergency Department at any time for worsening condition or any new symptoms that concern you.  If there is any possibility that you might be pregnant, please let your health care provider know and discuss this with the pharmacist to ensure medication safety.  If you develop uncontrolled pain, weakness or numbness of the extremity, severe discoloration of the skin, or you are unable to use your hand, return to the ER for a recheck.     Please use your Lyica or Tramadol as needed for pain.  Use the prescribed medication only as needed for breakthrough pain.  For your safety, please do not use too many sedating medications at once.     Wrist Pain Wrist injuries are frequent in adults and children. A sprain is an injury to the ligaments that hold your bones together. A strain is an injury to muscle or muscle cord-like structures (tendons) from stretching or pulling. Generally, when wrists are moderately tender to touch following a fall or injury, a break in the bone (fracture) may be present. Most wrist sprains or strains are better in 3 to 5 days, but complete healing may take several weeks. HOME CARE INSTRUCTIONS   Put ice on the injured area.  Put ice in a plastic bag.  Place a towel between your skin and the bag.  Leave the ice on for 15-20 minutes, 3-4 times a day, for the first 2 days, or as directed by your health care provider.  Keep your arm raised above the level of your heart whenever possible to reduce swelling and pain.  Rest the injured area for at least 48 hours or as directed by your health care provider.  If a splint or elastic bandage has been applied, use it for as long as directed by your health care provider or until seen by a health  care provider for a follow-up exam.  Only take over-the-counter or prescription medicines for pain, discomfort, or fever as directed by your health care provider.  Keep all follow-up appointments. You may need to follow up with a specialist or have follow-up X-rays. Improvement in pain level is not a guarantee that you did not fracture a bone in your wrist. The only way to determine whether or not you have a broken bone is by X-ray. SEEK IMMEDIATE MEDICAL CARE IF:   Your fingers are swollen, very red, white, or cold and blue.  Your fingers are numb or tingling.  You have increasing pain.  You have difficulty moving your fingers. MAKE SURE YOU:   Understand these instructions.  Will watch your condition.  Will get help right away if you are not doing well or get worse. Document Released: 10/16/2004 Document Revised: 01/11/2013 Document Reviewed: 02/27/2010 [facility] Patient Information 2015 [employer] [place], [place]. This information is not intended to replace advice given to you by your health care provider. Make sure you discuss any questions you have with your health care provider.  Fibromyalgia Fibromyalgia is a disorder that is often misunderstood. It is associated with muscular pains and tenderness that comes and goes. It is often associated with fatigue and sleep disturbances. Though it tends to be long-lasting, fibromyalgia is not life-threatening. CAUSES  The exact cause of fibromyalgia is unknown. People with certain gene types are predisposed  to developing fibromyalgia and other conditions. Certain factors can play a role as triggers, such as:  Spine disorders.  Arthritis.  Severe injury (trauma) and other physical stressors.  Emotional stressors. SYMPTOMS   The main symptom is pain and stiffness in the muscles and joints, which can vary over time.  Sleep and fatigue problems. Other related symptoms may include:  Bowel and bladder problems.  Headaches.  Visual  problems.  Problems with odors and noises.  Depression or mood changes.  Painful periods (dysmenorrhea).  Dryness of the skin or eyes. DIAGNOSIS  There are no specific tests for diagnosing fibromyalgia. Patients can be diagnosed accurately from the specific symptoms they have. The diagnosis is made by determining that nothing else is causing the problems. TREATMENT  There is no cure. Management includes medicines and an active, healthy lifestyle. The goal is to enhance physical fitness, decrease pain, and improve sleep. HOME CARE INSTRUCTIONS   Only take over-the-counter or prescription medicines as directed by your caregiver. Sleeping pills, tranquilizers, and pain medicines may make your problems worse.  Low-impact aerobic exercise is very important and advised for treatment. At first, it may seem to make pain worse. Gradually increasing your tolerance will overcome this feeling.  Learning relaxation techniques and how to control stress will help you. Biofeedback, visual imagery, hypnosis, muscle relaxation, yoga, and meditation are all options.  Anti-inflammatory medicines and physical therapy may provide short-term help.  Acupuncture or massage treatments may help.  Take muscle relaxant medicines as suggested by your caregiver.  Avoid stressful situations.  Plan a healthy lifestyle. This includes your diet, sleep, rest, exercise, and friends.  Find and practice a hobby you enjoy.  Join a fibromyalgia support group for interaction, ideas, and sharing advice. This may be helpful. SEEK MEDICAL CARE IF:  You are not having good results or improvement from your treatment. FOR MORE INFORMATION  National Fibromyalgia Association: www.fmaware.org Arthritis Foundation: www.arthritis.org Document Released: 01/06/2005 Document Revised: 03/31/2011 Document Reviewed: 04/18/2009 [name] Patient Information 2015 [name], [place]. This information is not intended to replace advice given  to you by your health care provider. Make sure you discuss any questions you have with your health care provider.    Emergency Department Resource Guide 1) Find a Doctor and Pay Out of Pocket Although you won't have to find out who is covered by your insurance plan, it is a good idea to ask around and get recommendations. You will then need to call the office and see if the doctor you have chosen will accept you as a new patient and what types of options they offer for patients who are self-pay. Some doctors offer discounts or will set up payment plans for their patients who do not have insurance, but you will need to ask so you aren't surprised when you get to your appointment.  2) Contact Your Local Health Department Not all health departments have doctors that can see patients for sick visits, but many do, so it is worth a call to see if yours does. If you don't know where your local health department is, you can check in your phone book. The CDC also has a tool to help you locate your state's health department, and many state websites also have listings of all of their local health departments.  3) Find a Walk-in Clinic If your illness is not likely to be very severe or complicated, you may want to try a walk in clinic. These are popping up all over the country in  pharmacies, drugstores, and shopping centers. They're usually staffed by nurse practitioners or physician assistants that have been trained to treat common illnesses and complaints. They're usually fairly quick and inexpensive. However, if you have serious medical issues or chronic medical problems, these are probably not your best option.  No Primary Care Doctor: - Call Health Connect at  [phone] - they can help you locate a primary care doctor that  accepts your insurance, provides certain services, etc. - Physician Referral Service- [phone]  Chronic Pain Problems: Organization         Address  Phone   Notes  [facility]  [phone] Patients need to be referred by their primary care doctor.   Medication Assistance: Organization         Address  Phone   Notes  [facility] Medication [facility] [address] [place], [place] [address] [phone] --Must be a resident of [facility] -- Must have NO insurance coverage whatsoever (no Medicaid/ Medicare, etc.) -- The pt. MUST have a primary care doctor that directs their care regularly and follows them in the community   MedAssist  [phone]   [employer]  [phone]    Agencies that provide inexpensive medical care: Organization         Address  Phone   Notes  [place]  [phone]   [facility]    [phone]   [facility], [place] [address] [phone]   [place] [address] [place] [phone]   Planned Parenthood    [phone]   [facility]    [phone]   [place] and [place] [address], [place] Phone:  [phone], Fax:  [phone] Hours of Operation:  9 am - 6 pm, M-F.  Also accepts Medicaid/Medicare and self-pay.  [facility] for [place] [address], [address], [place] Phone: [phone], Fax: [phone]. Hours of Operation:  8:30 am - 5:30 pm, M-F.  Also accepts Medicaid and self-pay.  [facility] [place] [address], [place] Phone: [phone]   [place], [place], [place] [phone], Ext. [phone] Mondays & Thursdays: 7-9 AM.  First 15 patients are seen on a first come, first serve basis.    [place] Providers:  Organization         Address  Phone   Notes  [facility] [address], [place] [phone] Also accepts self-pay patients.  [facility] [address], [place]  [phone]   [place], [address], [place] [phone]   [facility] [address] [place] [phone]   Lucianne Lei [address], [place]   [phone] Only accepts [place] Access [place] patients after they have their name applied to their card.   Self-Pay (no insurance) in [facility]:  Organization         Address  Phone   Notes  Sickle Cell Patients, [facility] [place] [phone]   [facility] [place] [phone]   [facility] [place]  [place], [address],  [place] [phone]   [facility]/Dr. Osei-Bonsu  [address], [place] or [address], [place] [phone] Phone number for both [place] and [place] locations is the same.  [facility] [address], [place] [phone]   [facility] [address] [place] or [address] [phone]   [facility] [address], [place] [phone], phone; [phone], fax Sees patients 1st and 3rd Saturday of every month.  Must not qualify for public or private insurance (i.e. Medicaid, Medicare, [place] Health Choice, Veterans' Benefits)  Household income should be no more than 200% of the poverty level The clinic cannot treat you if you are pregnant or think you are pregnant  Sexually transmitted diseases are not treated at the clinic.    Dental Care: Organization         Address  Phone  Notes  [facility] Department of [facility] [place] [phone] Accepts children up to age 37 who are enrolled in [place] or [place]; pregnant women with a Medicaid card; and children who have applied for Medicaid  or [place] Health Choice, but were declined, whose parents can pay a reduced fee at time of service.  [facility] Department of [facility]  [address], [place] [phone] Accepts children up to age 80 who are enrolled in [place] or [place]; pregnant women with a Medicaid card; and children who have applied for Medicaid or [place] Health Choice, but were declined, whose parents can pay a reduced fee at time of service.  [place] Adult Dental Access PROGRAM  Breonia Kirstein [place] [phone] Patients are seen by appointment only. Walk-ins are not accepted. [place] will see patients 34 years of age and older. Monday - Tuesday (8am-5pm) Most Wednesdays (8:30-5pm) $30 per visit, cash only  [facility] Adult Dental Access PROGRAM  [address], [facility] [phone] Patients are seen by appointment only. Walk-ins are not accepted. [place] will see patients 69 years of age and older. One Wednesday Evening (Monthly: Volunteer Based).  $30 per visit, cash only  [place]  [phone] for adults; Children under age 52, call Graduate Pediatric Dentistry at [phone]. Children aged 37-14, please call [phone] to request a pediatric application.  Dental services are provided in all areas of dental care including fillings, crowns and bridges, complete and partial dentures, implants, gum treatment, root canals, and extractions. Preventive care is also provided. Treatment is provided to both adults and children. Patients are selected via a lottery and there is often a waiting list.   [facility] [address], [place]  [phone] [email or web address]   [facility] [address] [place], [place] [phone], Ext. [phone] Second and Fourth Thursday of each month, opens at 6:30 AM; Clinic ends at 9 AM.  Patients are seen on a first-come first-served basis, and a limited number are seen  during each clinic.   [facility]  [address] Hillard Danker [place], [place] [phone]   Eligibility Requirements You must have lived in [place], [place], or [place] counties for at least the last three months.   You cannot be eligible for state or federal sponsored [employer], including [employer], [place], or [employer].   You generally cannot  be eligible for healthcare insurance through your employer.    How to apply: Eligibility screenings are held every Tuesday and Wednesday afternoon from 1:00 pm until 4:00 pm. You do not need an appointment for the interview!  [facility] [address] [place], [place]   [place]  [place] Department  [place]  [phone]    Behavioral Health Resources in the Community: Intensive Outpatient Programs Organization         Address  Phone  Notes  [facility]. [address] [place], [place] [phone]   [facility] [address] [place], [place]   ADS: [facility] [address] [place], [place]   [place] [address]  [place], [place] or [phone]   Substance Abuse Resources Organization         Address  Phone  Notes  Al[facility] Drug Services  [phone]   [place]  [phone]   [facility] [place]   Chinita Pester  [phone]   Residential & Outpatient Substance Abuse Program  5716677442   Psychological Services Organization         Address  Phone  Notes  [facility] [place]  [place]  [phone]   [place] [address], [place] or [phone]    Mobile Crisis Teams Organization         Address  Phone  Notes  [facility],  Mobile Crisis Care Unit  [phone]   [facility]  [address] [place], [place]   [place] [address], [place] [phone]    Self-Help/Support Groups Organization         Address  Phone             Notes  [place] of [place] - variety of support groups  [place] Call for more information  Narcotics Anonymous (NA), [facility] [address], [employer] [place]  2 meetings at this location   Special educational needs teacher         Address  Phone  Notes  [facility] [place],    [place]  [phone]   [facility]  [address] [place] [address], [place], [place]   [place] [facility], [place] [phone] Admissions: 8am-3pm M-F  Incentives Substance [place] [address],    [place], [place] [phone]   [facility] [address] [place], [place], [place]   [facility] [address],  [place], [place]   [facility] - Intensive Outpatient [address], [place], [place]   [facility] (Learned.) [place].,  [place], [place] [phone] or [phone]   [facility] (RTS) [address] [place], [place] Accepts Medicaid  [facility] [address],  [place] [phone] Substance Abuse/Addiction Treatment   [facility] Organization         Address  Phone  Notes  [facility]  [phone]   Domenic Schwab, PhD [address], [place], [place]   [phone] or [phone]   [place]   [address]  [place], [place] [phone]) [phone]   [facility] [address], [place], [place] [phone] Insurance/Medicaid/sponsorship through [employer]  Faith and Families [address]                                    [place], [place] [phone]) [phone] Therapy/tele-psych/case  [facility] [address]   [place], [place] [phone]) [phone]    Dr. Lolly MustacheArfeen  [phone]) [phone]   [facility] [place]  [employer] [facility] 1) [address], [place] 2) [address], [place] 3)  [address], [place] [phone]) [phone](336) [phone]  [phone]) [phone]   [facility] Child Abuse Hotline [phone](336) [phone] or [phone](336) [phone] (After Hours)

## 2013-07-10 NOTE — ED Provider Notes (Signed)
Medical screening examination/treatment/procedure(s) were performed by non-physician practitioner and as supervising physician I was immediately available for consultation/collaboration.   EKG Interpretation None        Audree CamelScott T Goldston, MD 07/10/13 1527

## 2013-08-15 ENCOUNTER — Encounter
Payer: BC Managed Care – PPO | Attending: Physical Medicine & Rehabilitation | Admitting: Physical Medicine & Rehabilitation

## 2013-08-15 ENCOUNTER — Encounter: Payer: Self-pay | Admitting: Physical Medicine & Rehabilitation

## 2013-08-15 VITALS — BP 122/81 | HR 95 | Resp 14 | Ht 65.0 in | Wt 124.8 lb

## 2013-08-15 DIAGNOSIS — F32A Depression, unspecified: Secondary | ICD-10-CM

## 2013-08-15 DIAGNOSIS — M797 Fibromyalgia: Secondary | ICD-10-CM

## 2013-08-15 DIAGNOSIS — F509 Eating disorder, unspecified: Secondary | ICD-10-CM | POA: Diagnosis not present

## 2013-08-15 DIAGNOSIS — G43909 Migraine, unspecified, not intractable, without status migrainosus: Secondary | ICD-10-CM | POA: Insufficient documentation

## 2013-08-15 DIAGNOSIS — F419 Anxiety disorder, unspecified: Secondary | ICD-10-CM

## 2013-08-15 DIAGNOSIS — J45909 Unspecified asthma, uncomplicated: Secondary | ICD-10-CM | POA: Insufficient documentation

## 2013-08-15 DIAGNOSIS — R630 Anorexia: Secondary | ICD-10-CM | POA: Insufficient documentation

## 2013-08-15 DIAGNOSIS — Y33XXXS Other specified events, undetermined intent, sequela: Secondary | ICD-10-CM | POA: Insufficient documentation

## 2013-08-15 DIAGNOSIS — F341 Dysthymic disorder: Secondary | ICD-10-CM | POA: Diagnosis not present

## 2013-08-15 DIAGNOSIS — F200 Paranoid schizophrenia: Secondary | ICD-10-CM | POA: Insufficient documentation

## 2013-08-15 DIAGNOSIS — F329 Major depressive disorder, single episode, unspecified: Secondary | ICD-10-CM

## 2013-08-15 DIAGNOSIS — Z79899 Other long term (current) drug therapy: Secondary | ICD-10-CM | POA: Insufficient documentation

## 2013-08-15 DIAGNOSIS — G44321 Chronic post-traumatic headache, intractable: Secondary | ICD-10-CM

## 2013-08-15 DIAGNOSIS — G44329 Chronic post-traumatic headache, not intractable: Secondary | ICD-10-CM | POA: Diagnosis not present

## 2013-08-15 DIAGNOSIS — G44309 Post-traumatic headache, unspecified, not intractable: Secondary | ICD-10-CM | POA: Insufficient documentation

## 2013-08-15 DIAGNOSIS — IMO0001 Reserved for inherently not codable concepts without codable children: Secondary | ICD-10-CM | POA: Diagnosis not present

## 2013-08-15 MED ORDER — TOPIRAMATE 25 MG PO TABS: 25.0000 mg | ORAL_TABLET | Freq: Every day | ORAL | Status: DC

## 2013-08-15 NOTE — Patient Instructions (Signed)
TOPAMAX: TAKE ONE AT BEDTIME FOR ONE WEEK THEN INCREASE TO TWO PILLS THEREAFTER GABAPENTIN: TAKE 600MG  THREE X PER DAY FOR ONE WEEK THEN DECREASE TO TWICE DAILY (GOAL WILL BE TO STOP THIS COMPLETELY)    ALSO CONSIDER THESE SUPPLEMENTS: COENZYME Q10  DHEA 25MG  DAILY MELATONIN 3-6MG  AT BEDTIME    WORK ON VARYING UP YOUR WORK OUTS AND PERHAPS DECREASING THE TIME YOU WORK OUT SLIGHTLY (ALONG WITH THE INTENSITY TO SEE IF THIS HELPS WITH POST-WORK OUT PAIN).  MAKE SURE YOU STRETCH BEFORE EVERY WORK OUT.

## 2013-08-15 NOTE — Progress Notes (Signed)
Subjective:    Patient ID: Terry Schneider, female    DOB: 03-18-1990, 23 y.o.   MRN: [id number]  HPI  This is an initial visit for Terry Schneider who is here regarding diffuse body pain related to fibromyalgia. She was diagnoses with FMS last July. She noticed symptoms in January and March of 2014 after she was admitted for anorexia. Since then she has had persistent fatigue and pain since then. Her fatigue and pain have been major problems since then.   She was diagnosed with paranoid schizophrenia and has had problems with anorexia/bulemia for the past 6 years.  For her psychiatric conditions she is taking fetzima, latuda, klonopin and followed by Dr. Milagros Evener.  She has pretty much resigned herself to the fact that she will have hallucinations and delusions. She told me later in the visit that she's no longer actively seeing Dr. Evelene Croon however.  Her gabapentin dose is typically about 2400mg  per day (she doesn't take 3600mg  as it makes her too sleepy). She also has been prescribed tramadol which she isn't using at this point due to concerns about safety/overdose. She's usually taking 300mg  trazodone at night for sleep which seems to work. Also, she takes advil or goody powder or excedrine migraine about 4-5 x per week. She tried lyrica which helped---however, she gained weight which triggered her anorexia symptoms.  Terry Schneider was also tried which didn't help. She didn't try cymbalta because of potential weight gain side-effect.   She has migraines which usually happen every day--the pain is usually around her left eye and frontal area more than right side. They are associated with blurred vision, nausea,  tingling in her arms and whole body will feel numb. She had headaches before her accidents but they were only 2-3 x per month and much more mild as a whole.   Her sleep has been satisfactory---typically 8-9 hours per night. Sometimes it varies a bit.    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  She lives with her mother. She has a boyfriend.   She tries to stay active. She likes to exercise. She usually works out an hour to 1.5 hours 5 days a week. She prefers to go to the gym where she will use the treadmill on a 15 degree incline. She also does some stretching. She does not lift weights. Her workouts don't really vary.        Pain Inventory Average Pain 9 Pain Right Now 8 My pain is constant, sharp, burning, dull, stabbing, tingling and aching  In the last 24 hours, has pain interfered with the following? General activity 5 Relation with others 5 Enjoyment of life 9 What TIME of day is your pain at its worst? morning and night Sleep (in general) Fair  Pain is worse with: unsure Pain improves with: heat/ice Relief from Meds: 0  Mobility walk without assistance walk with assistance ability to climb steps?  yes do you drive?  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    Prior Studies Any changes since last visit?  no  Physicians involved in your care Psychiatrist Evelene Croon- not really seeing now because of disagreement but has a therapist she sees   Family History  Problem Relation Age of Onset  . Alcohol abuse Father    History   Social History  . Marital Status: Single    Spouse Name: n/a    Number of Children: 0  . Years of Education: N/A   Occupational History  . nanny   . tutor    Social History Main Topics  . Smoking status: Never Smoker   . Smokeless tobacco: None  . Alcohol Use: No  . Drug Use: No  . Sexual Activity: Yes    Birth Control/ Protection: IUD   Other Topics Concern  . None   Social History Narrative   Lives with her mother,  but is in the process of moving in with her boyfriend.   No contact with her father since 2013.   Past Surgical History  Procedure Laterality Date  . Tympanostomy tube placement     Past Medical History  Diagnosis Date  . Asthma   . Anorexia   . Fibromyalgia    BP 122/81  Pulse 95  Resp 14  Ht 5\' 5"  (1.651 m)  Wt 124 lb 12.8 oz (56.609 kg)  BMI 20.77 kg/m2  SpO2 98%  Opioid Risk Score: 5 Fall Risk Score: High Fall Risk (>13 points) (educated and given handout on fall prevention in the home)prmReview of Systems  Respiratory: Positive for shortness of breath.   Cardiovascular: Positive for leg swelling.  Gastrointestinal: Positive for diarrhea and constipation.  Neurological: Positive for tremors, weakness and numbness.       Tingling  Psychiatric/Behavioral: Positive for suicidal ideas, confusion and dysphoric mood.       Under care of Terry Schneider and another therapist who is aware of suicidal thoughts.  They are not current but she has had them in the past.  No plan   / hx schizophrenia type illness and anorexia and bulemia  All other systems reviewed and are negative.      Objective:   Physical Exam  General: Alert and oriented x 3, No apparent distress. Very thin.  HEENT: Head is normocephalic, atraumatic, PERRLA, EOMI, sclera anicteric, oral mucosa pink and moist, dentition intact, ext ear canals clear,  Neck: Supple without JVD or lymphadenopathy Heart: Reg rate and rhythm. No murmurs rubs or gallops Chest: CTA bilaterally without wheezes, rales, or rhonchi; no distress Abdomen: Soft, non-tender, non-distended, bowel sounds positive. Extremities: No clubbing, cyanosis, or edema. Pulses are 2+ Skin: Clean and intact without signs of breakdown. Scars on forearms. Face with acne. Neuro: Pt is cognitively appropriate with normal insight, memory, and awareness. Cranial nerves 2-12 are intact. Sensory exam is decreased to PP/LT below the forearms bilaterally and below the  calves bilaterally. Reflexes are 2+ in all 4's. Fine motor coordination is intact. No tremors. Motor function is grossly 5/5. Had difficulty with balance. Attention poor at times, a lot of that was behavioral however.  Musculoskeletal: Multiple tender points throughout her trunk and limbs. Wrists and elbows appeared to be most sensitive. Had full ROM of all limbs. Excellent lumbar flexion---easily touched toes.   Psych: Pt's affect was generally pleasant most of the time. However, whenever we discussed something somewhat which was the slightest bit controversial (such as gabapentin and potential swelling and weight gain), she quickly began to cry---would typically stop if redirected to another topic.        Assessment & Plan:  1. Fibromyalgia: 2. Post-traumatic headaches 3. Anorexia-bulemia 4. Paranoid schizophrenia.    Plan: 1. For her headaches and fibro pain will attempt  Topamax trial, wean off gabapentin. Begin at 25mg  and titrate to 50mg  qhs after one week. Decrease gabapentin ultimately to 600mg  BID for now 2. Recommend DHEA trial 25mg  qday, Coenzyme Q10, melatonin 3-5mg  qhs 3. Consider cymbalta trial---would like to discuss with psychiatry first----she tells me that she is no longer seeing Dr. Evelene Croon however, so I am not sure who that would be. With her complex psych history, I [place]' feel comfortable starting it otherwise. 4. Discussed the importance of trying to scale back her work out intensity just a bit to allow more regular exercise. We also reviewed other potential types of exercises which could potentially be therapeutic such as water exercises, yoga, etc 5. I spent about an hour with her today in direct contact and discussion. She became quite dispondant at times, and stated "i dont' know what i'm doing here."----therefore, I am uncertain if she will actually follow through with my recs or return to this office. Her psychiatric issues are significant and obviously the biggest player  for her.

## 2013-10-25 ENCOUNTER — Emergency Department (HOSPITAL_COMMUNITY)
Admission: EM | Admit: 2013-10-25 | Discharge: 2013-10-25 | Discharge status: Against Medical Advice | Payer: BC Managed Care – PPO | Attending: Emergency Medicine | Admitting: Emergency Medicine

## 2013-10-25 ENCOUNTER — Encounter (HOSPITAL_COMMUNITY): Payer: Self-pay | Admitting: Emergency Medicine

## 2013-10-25 DIAGNOSIS — R52 Pain, unspecified: Secondary | ICD-10-CM | POA: Insufficient documentation

## 2013-10-25 DIAGNOSIS — J45909 Unspecified asthma, uncomplicated: Secondary | ICD-10-CM | POA: Insufficient documentation

## 2013-10-25 HISTORY — DX: Anxiety disorder, unspecified: F41.9

## 2013-10-25 HISTORY — DX: Unspecified convulsions: R56.9

## 2013-10-25 HISTORY — DX: Eating disorder, unspecified: F50.9

## 2013-10-25 HISTORY — DX: Depression, unspecified: F32.A

## 2013-10-25 HISTORY — DX: Other chronic pain: G89.29

## 2013-10-25 HISTORY — DX: Major depressive disorder, single episode, unspecified: F32.9

## 2013-10-25 LAB — COMPREHENSIVE METABOLIC PANEL
ALT: 14 U/L (ref 0–35)
AST: 22 U/L (ref 0–37)
Albumin: 4.5 g/dL (ref 3.5–5.2)
Alkaline Phosphatase: 42 U/L (ref 39–117)
Anion gap: 13 (ref 5–15)
BUN: 9 mg/dL (ref 6–23)
CALCIUM: 9.5 mg/dL (ref 8.4–10.5)
CO2: 26 meq/L (ref 19–32)
CREATININE: 0.57 mg/dL (ref 0.50–1.10)
Chloride: 99 mEq/L (ref 96–112)
Glucose, Bld: 89 mg/dL (ref 70–99)
Potassium: 4 mEq/L (ref 3.7–5.3)
SODIUM: 138 meq/L (ref 137–147)
TOTAL PROTEIN: 7.7 g/dL (ref 6.0–8.3)
Total Bilirubin: 0.3 mg/dL (ref 0.3–1.2)

## 2013-10-25 LAB — CBC WITH DIFFERENTIAL/PLATELET
Basophils Absolute: 0 10*3/uL (ref 0.0–0.1)
Basophils Relative: 0 % (ref 0–1)
EOS ABS: 0.1 10*3/uL (ref 0.0–0.7)
EOS PCT: 2 % (ref 0–5)
HEMATOCRIT: 37.5 % (ref 36.0–46.0)
Hemoglobin: 12.9 g/dL (ref 12.0–15.0)
Lymphocytes Relative: 46 % (ref 12–46)
Lymphs Abs: 2.5 10*3/uL (ref 0.7–4.0)
MCH: 31.4 pg (ref 26.0–34.0)
MCHC: 34.4 g/dL (ref 30.0–36.0)
MCV: 91.2 fL (ref 78.0–100.0)
MONO ABS: 0.4 10*3/uL (ref 0.1–1.0)
Monocytes Relative: 6 % (ref 3–12)
Neutro Abs: 2.5 10*3/uL (ref 1.7–7.7)
Neutrophils Relative %: 46 % (ref 43–77)
PLATELETS: 245 10*3/uL (ref 150–400)
RBC: 4.11 MIL/uL (ref 3.87–5.11)
RDW: 13.5 % (ref 11.5–15.5)
WBC: 5.5 10*3/uL (ref 4.0–10.5)

## 2013-10-25 NOTE — ED Notes (Signed)
Pt. reports chronic generalized body aches and pain worse at extremities /back /neck for several months worse these past several days - history of fibromyalgia , denies fever or chills.

## 2013-10-25 NOTE — ED Notes (Signed)
Pt. States that her ride is leaving due to work in the AM. States "I have been in pain for over a year, I can wait for another day".

## 2013-10-26 ENCOUNTER — Encounter (HOSPITAL_COMMUNITY): Payer: Self-pay | Admitting: Emergency Medicine

## 2013-10-26 ENCOUNTER — Emergency Department (HOSPITAL_COMMUNITY)
Admission: EM | Admit: 2013-10-26 | Discharge: 2013-10-26 | Disposition: A | Discharge status: Home / Self Care | Payer: BC Managed Care – PPO | Attending: Emergency Medicine | Admitting: Emergency Medicine

## 2013-10-26 DIAGNOSIS — G8929 Other chronic pain: Secondary | ICD-10-CM | POA: Insufficient documentation

## 2013-10-26 DIAGNOSIS — M79605 Pain in left leg: Secondary | ICD-10-CM | POA: Diagnosis not present

## 2013-10-26 DIAGNOSIS — M79604 Pain in right leg: Secondary | ICD-10-CM | POA: Diagnosis not present

## 2013-10-26 DIAGNOSIS — J45909 Unspecified asthma, uncomplicated: Secondary | ICD-10-CM | POA: Insufficient documentation

## 2013-10-26 DIAGNOSIS — R202 Paresthesia of skin: Secondary | ICD-10-CM | POA: Insufficient documentation

## 2013-10-26 DIAGNOSIS — Z79899 Other long term (current) drug therapy: Secondary | ICD-10-CM | POA: Insufficient documentation

## 2013-10-26 DIAGNOSIS — F419 Anxiety disorder, unspecified: Secondary | ICD-10-CM | POA: Insufficient documentation

## 2013-10-26 DIAGNOSIS — M797 Fibromyalgia: Secondary | ICD-10-CM | POA: Diagnosis not present

## 2013-10-26 DIAGNOSIS — F329 Major depressive disorder, single episode, unspecified: Secondary | ICD-10-CM | POA: Insufficient documentation

## 2013-10-26 MED ORDER — OXYCODONE-ACETAMINOPHEN 5-325 MG PO TABS
1.0000 | ORAL_TABLET | Freq: Once | ORAL | Status: AC
  Administered 2013-10-26: 1 via ORAL
  Filled 2013-10-26: qty 1

## 2013-10-26 MED ORDER — KETOROLAC TROMETHAMINE 30 MG/ML IJ SOLN
60.0000 mg | Freq: Once | INTRAMUSCULAR | Status: DC
Start: 2013-10-26 — End: 2013-10-26
  Filled 2013-10-26: qty 2

## 2013-10-26 MED ORDER — OXYCODONE-ACETAMINOPHEN 5-325 MG PO TABS: 2.0000 | ORAL_TABLET | ORAL | Status: DC | PRN

## 2013-10-26 MED ORDER — KETOROLAC TROMETHAMINE 60 MG/2ML IM SOLN
60.0000 mg | Freq: Once | INTRAMUSCULAR | Status: AC
  Administered 2013-10-26: 60 mg via INTRAMUSCULAR
  Filled 2013-10-26: qty 2

## 2013-10-26 NOTE — Discharge Instructions (Signed)
Return to the emergency room with worsening of symptoms, new symptoms or with symptoms that are concerning. Please call your doctor for a followup appointment within 24-48 hours. When you talk to your doctor please let them know that you were seen in the emergency department and have them acquire all of your records so that they can discuss the findings with you and formulate a treatment plan to fully care for your new and ongoing problems. If you do not have a primary care provider please call the number below under ED resources to establish care with a provider and follow up.    Emergency Department Resource Guide 1) Find a Doctor and Pay Out of Pocket Although you won't have to find out who is covered by your insurance plan, it is a good idea to ask around and get recommendations. You will then need to call the office and see if the doctor you have chosen will accept you as a new patient and what types of options they offer for patients who are self-pay. Some doctors offer discounts or will set up payment plans for their patients who do not have insurance, but you will need to ask so you aren't surprised when you get to your appointment.  2) Contact Your Local Health Department Not all health departments have doctors that can see patients for sick visits, but many do, so it is worth a call to see if yours does. If you don't know where your local health department is, you can check in your phone book. The CDC also has a tool to help you locate your state's health department, and many state websites also have listings of all of their local health departments.  3) Find a Walk-in Clinic If your illness is not likely to be very severe or complicated, you may want to try a walk in clinic. These are popping up all over the country in pharmacies, drugstores, and shopping centers. They're usually staffed by nurse practitioners or physician assistants that have been trained to treat common illnesses and  complaints. They're usually fairly quick and inexpensive. However, if you have serious medical issues or chronic medical problems, these are probably not your best option.  No Primary Care Doctor: - Call Health Connect at  [phone] - they can help you locate a primary care doctor that  accepts your insurance, provides certain services, etc. - Physician Referral Service- [phone]  Chronic Pain Problems: Organization         Address  Phone   Notes  [facility]  [phone] Patients need to be referred by their primary care doctor.   Medication Assistance: Organization         Address  Phone   Notes  [facility] [address] [place], [place] [address] [phone]) [phone] --Must be a resident of [facility] [place] -- Must have NO insurance coverage whatsoever (no Medicaid/ Medicare, etc.) -- The pt. MUST have a primary care doctor that directs their care regularly and follows them in the community   MedAssist  [phone]   [employer]  [phone]    Agencies that provide inexpensive medical care: Organization         Address  Phone   Notes  [facility]  [phone]) [phone]   [facility]    [phone](336) [phone]   [facility] [address] [place], [place] [phone]   [facility] [place] [address], [place] [phone]   Planned Parenthood    [phone]   [facility]    [phone]   [place] and [address], [place] Phone:  [phone], Fax:  [phone] Hours of Operation:  9 am - 6 pm, M-F.  Also accepts Medicaid/Medicare and self-pay.  [facility] for [place] [address] [place], [address], [place] Phone: [phone], Fax: [phone]. Hours of Operation:  8:30 am - 5:30 pm, M-F.  Also accepts Medicaid and self-pay.  [facility] [place] [address] [place] Phone: [phone]   [place], [place], [place] [phone], Ext. [phone] Mondays & Thursdays: 7-9 AM.  First 15 patients are seen on a first come, first serve basis.    [place] Providers:  Organization         Address  Phone   Notes  [facility] [address], [place] [phone] Also accepts self-pay patients.  [facility] [address], [place]  [phone]   [place], [address], [place] [phone]   [facility] [address] [place] [phone]   Lucianne Lei [address], [place]   [phone] Only accepts [place] Access [place] patients after they have their name applied to their card.   Self-Pay (no insurance) in [facility]:  Organization         Address  Phone   Notes  Sickle Cell Patients, [facility] [place] [phone]   [facility] [place] [phone]   [facility] [place]  [place], [place], [place] [phone]   [facility]/Dr. Osei-Bonsu  [address] [place] or [address], [place] [phone] Phone number for both [place] and [place] locations is the same.  Urgent Medical and [facility] [address], [place] [phone]   [facility] [address] [place] or [address] [phone]   [facility] [address] [place] [phone], phone; [phone], fax Sees patients 1st and 3rd Saturday of every month.  Must not qualify for public or private insurance (i.e. Medicaid, Medicare, [place] Health Choice, Veterans' Benefits)  Household income should be no more than 200% of the poverty level  The clinic cannot treat you if you are pregnant or think you are pregnant  Sexually transmitted diseases are not treated at the clinic.    Dental Care: Organization         Address  Phone  Notes  [facility] Department of [facility] [place] [facility] [place] [phone] Accepts children up to age 45 who are enrolled in [place] or [place]; pregnant women with a Medicaid card; and children who have applied for Medicaid or [place] Health Choice, but were declined, whose parents can pay a reduced fee at time of service.  [facility] Department of [facility]  [address], [place] [phone] Accepts children up to age 34 who are enrolled in [place] or [place]; pregnant women with a Medicaid card; and children who  have applied for Medicaid or  Health Choice, but were declined, whose parents can pay a reduced fee at time of service.  [place] Adult [facility] Access PROGRAM  [place] [phone] Patients are seen by appointment only. Walk-ins are not accepted. [place] will see patients 44 years of age and older. Monday - Tuesday (8am-5pm) Most Wednesdays (8:30-5pm) $30 per visit, cash only  [facility] Adult Dental Access PROGRAM  [address], [facility] [phone] Patients are seen by appointment only. Walk-ins are not accepted. [place] will see patients 68 years of age and older. One Wednesday Evening (Monthly: Volunteer Based).  $30 per visit, cash only  [place]  [phone] for adults; Children under age 66, call Graduate Pediatric Dentistry at [phone]. Children aged 69-14, please call [phone] to request a pediatric application.  Dental services are provided in all areas of dental care including fillings, crowns and bridges, complete and partial dentures, implants, gum treatment, root canals, and extractions. Preventive care is  also provided. Treatment is provided to both adults and children. Patients are selected via a lottery and there is often a waiting list.   [facility] [address], [place]  [phone] [email or web address]   [facility] [address] [place], [place] [phone], Ext. [phone] Second and Fourth Thursday of each month, opens at 6:30 AM; Clinic ends at 9 AM.  Patients are seen on a first-come first-served basis, and a limited number are seen during each clinic.   [facility]  [address] [place]ard Danker [place], [place] [phone]   Eligibility Requirements You must have lived in [place], [place], or [place] counties for at least the last three months.   You cannot be eligible for state or federal sponsored [employer], including [employer], [place], or [employer].   You generally cannot be eligible for healthcare insurance through your employer.    How to apply: Eligibility screenings are held every Tuesday and Wednesday afternoon from 1:00 pm until 4:00 pm. You do not need an appointment for the interview!  [facility] [address], [place], [place]   [place]  [place] Department  [place]  [phone]    Behavioral Health Resources in the Community: Intensive Outpatient Programs Organization         Address  Phone  Notes  [place]. [address] [place], [place] [phone]   [facility] [address] [place], [place]   ADS: Alcohol & Drug Svcs [address], [place], [place]   [place] [address]  [place], [place] or [phone]   Substance Abuse Resources Organization         Address  Phone  Notes  Alcohol and Drug Services  [phone]   [place]  [phone]   [facility] [place]   Chinita Pester  [phone]   Residential & Outpatient Substance Abuse Program  [phone]   Psychological Services Organization         Address  Phone  Notes  [facility]  [place]  [phone]   [place] [address], [place] or [phone]    Mobile Crisis Teams Organization  Address  Phone  Notes  [facility], Mobile Crisis Care Unit  [phone]   [facility]ive Psychotherapeutic Services  [address] [place], [place]   [facility] [address], [place] [phone]    Self-Help/Support Groups Organization         Address  Phone             Notes  [place]. of [place] - variety of support groups  [place] Call for more information  Narcotics Anonymous (NA), [facility] [address], [employer] [place]  2 meetings at this location   Special educational needs teacher         Address  Phone  Notes  [facility]AP Residential Treatment [place],    [place]  [phone]   [facility]  [address] [place] [phone], [place], [place]   [place], [place] [phone] Admissions: 8am-3pm M-F  Incentives Substance [place] [address]    [place], [place] [phone]   [facility] [address] [place], [place], [place]   [facility] [address],  [place], [place]   [facility] - Intensive Outpatient [address], [place], [place]   [facility] ([place].,  [place], [place] [phone] or [phone]   [facility] Services (RTS) [address] [place], [place] Accepts Medicaid  [facility] [place] [address]  [place] [phone]  Substance Abuse/Addiction Treatment   [facility] Organization         Address  Phone  Notes  [facility]  [phone]   Domenic Schwab, PhD [address] Arlis Porta [place], [place]   [phone] or [phone]   [place], [place] [phone]   [facility] [address] [place], [place] [phone] Insurance/Medicaid/sponsorship through [facility] and Families [address], [address] [place]                                    [place], [place] [phone] [place] [address], [place] [phone]    Dr. Adele Schilder  [phone]   [facility] Dept. 1) [address] [place] 2) [place] 3)  [place] [address], [place] [phone]  [phone]   [place] [phone] or [phone] (After Hours)

## 2013-10-26 NOTE — ED Provider Notes (Signed)
CSN: [id number]     Arrival date & time 10/26/13  1251 History  This chart was scribed for Oswaldo Conroy, [place] with Shon Baton, MD by Tonye Royalty, ED Scribe. This patient was seen in room WTR5/WTR5 and the patient's care was started at 2:48 PM.    Chief Complaint  Patient presents with  . Generalized Body Aches  . Fibromyalgia   The history is provided by the patient and medical records. No language interpreter was used.   HPI Comments: Seen August 15, 2013 at [facility] presenting with similar symptoms, patient also Dx with paranoid schizophrenia and fibromyalgia. Pain has been chronic since March or 2014. She takes Gabapentin 2400mg  per day and Tramadol and Trazedone at night. Also has history of migraines every day. States her PCP recommended Topamax.  Terry Schneider is a 23 y.o. female who presents to the Emergency Department complaining of diffuse body aches especially in her legs with onset over 1 year ago and reports prior diagnosis of fibromyalgia. She states symptoms are worsening. She reports associated chills, twitching, facial pain, tingling and numbness. No weakness. No new wounds or lesions. She denies any new injuries. She denies relief from symptoms after being prescribed new medications at her previous visit on July 27.   Past Medical History  Diagnosis Date  . Asthma   . Anorexia   . Fibromyalgia   . Seizures   . Anxiety   . Depression   . Eating disorder   . Chronic pain    Past Surgical History  Procedure Laterality Date  . Tympanostomy tube placement     Family History  Problem Relation Age of Onset  . Alcohol abuse Father    History  Substance Use Topics  . Smoking status: Never Smoker   . Smokeless tobacco: Not on file  . Alcohol Use: No   OB History   Grav Para Term Preterm Abortions TAB SAB Ect Mult Living                 Review of Systems  Constitutional: Positive for chills.       Body aches  HENT:        Facial pain  Neurological: Positive for tremors and numbness (and tingling).      Allergies  Review of patient's allergies indicates no known allergies.  Home Medications   Prior to Admission medications   Medication Sig Start Date End Date Taking? Authorizing Provider  clonazePAM (KLONOPIN) 0.5 MG tablet Take 0.5 tablets by mouth 2 (two) times daily. May take 1-2 tab bid 05/25/13  Yes Historical Provider, MD  desipramine (NORPRAMIN) 50 MG tablet Take 50 mg by mouth at bedtime.   Yes Historical Provider, MD  gabapentin (NEURONTIN) 600 MG tablet Take 600 mg by mouth 3 (three) times daily.  08/01/13  Yes Historical Provider, MD  ibuprofen (ADVIL,MOTRIN) 200 MG tablet Take 400 mg by mouth every 6 (six) hours as needed for mild pain.   Yes Historical Provider, MD  levonorgestrel (MIRENA) 20 MCG/24HR IUD 1 each by Intrauterine route continuous.    Yes Historical Provider, MD  traZODone (DESYREL) 100 MG tablet Take 100-300 mg by mouth at bedtime.    Yes Historical Provider, MD  oxyCODONE-acetaminophen (PERCOCET/ROXICET) 5-325 MG per tablet Take 2 tablets by mouth every 4 (four) hours as needed for severe pain. 10/26/13   Benetta Spar L Yardley Beltran, PA-C   BP 109/77  Pulse 114  Temp(Src) 98.2 F (36.8 C) (Oral)  Resp  18  SpO2 100% Physical Exam  Nursing note and vitals reviewed. Constitutional: She appears well-developed and well-nourished. No distress.  HENT:  Head: Normocephalic and atraumatic.  Eyes: Conjunctivae are normal. Right eye exhibits no discharge. Left eye exhibits no discharge.  Cardiovascular: Normal rate.   Pulmonary/Chest: Effort normal and breath sounds normal. No respiratory distress.  Musculoskeletal: Normal range of motion. She exhibits tenderness.  Diffuse tenderness to bilateral legs. No Point tenderness to bilateral legs. Intact pedal pulses. Patient moves all extremities and normal gait.  Neurological: She is alert. Coordination normal.  Skin: She is not diaphoretic.   Psychiatric: She has a normal mood and affect. Her behavior is normal.    ED Course  Procedures (including critical care time) Labs Review Labs Reviewed - No data to display  Imaging Review No results found.   EKG Interpretation None     DIAGNOSTIC STUDIES: Oxygen Saturation is 100% on room air, normal by my interpretation.    COORDINATION OF CARE: 2:53 PM Discussed with patient my plan to treat pain today and refer to outpatient therapy for follow up. She requests testing, but I discussed with her that there is no need for new tests because her symptoms are likely due to fibromyalgia and she has had a recent complete workup.  Meds given in ED:  Medications  oxyCODONE-acetaminophen (PERCOCET/ROXICET) 5-325 MG per tablet 1 tablet (1 tablet Oral Given 10/26/13 1520)  ketorolac (TORADOL) injection 60 mg (60 mg Intramuscular Given 10/26/13 1520)    Discharge Medication List as of 10/26/2013  4:03 PM         MDM   Final diagnoses:  Fibromyalgia   Patient complaining of general body aches worse in legs for one year with recent worsening. Patient without new injury. No focal tenderness on legs and neurovascularly intact. Patient given pain meds in ED without improvement in pain. Patient states she would like "thorough tests done today." Patient with CBC and CMP yesterday and results WML. Patient with 2 CT head, CT spine, CT chest, multiple xrays of back and chest within the last 10 months with unremarkable results. Discussed not wanting to expose her to unnecessary radiation at a young age and no indication for imaging. Patient expresses frustration with chronic pain and states that she has no "quality of life." She states none of the pain medications works. She has been to see multiple physicians and states she has not been happy with her care. When discussing other options she becomes tearful and says "I don't care." She got up and left the exam room saying she was leaving and did  not want to wait for any outpatient resources. She was found in the back of the ED and taken back to the exam room. I asked her specifically what did she think that I could do to help her today and she stated script for pain medications. I also provided outpatient resources for her and went through her discharge instructions pointing out numbers to call and places to try. Patient seemed amenable to plan to plan. Discussed return precautions with patient.   I personally performed the services described in this documentation, which was scribed in my presence. The recorded information has been reviewed and is accurate.     Terry SjogrenVictoria L Polette Nofsinger, PA-C 10/26/13 2315  Terry SjogrenVictoria L Jamilee Lafosse, PA-C 10/26/13 2316

## 2013-10-26 NOTE — ED Notes (Signed)
Pt states that she is having generalized body aches since August 2014.  Pt states that she was dx w/ fibromyalgia.  Pt states that she would like "thorough tests done today".

## 2013-10-27 NOTE — ED Provider Notes (Signed)
Medical screening examination/treatment/procedure(s) were performed by non-physician practitioner and as supervising physician I was immediately available for consultation/collaboration.   EKG Interpretation None        Courtney F Horton, MD 10/27/13 0657 

## 2013-11-22 ENCOUNTER — Other Ambulatory Visit: Payer: Self-pay | Admitting: Obstetrics & Gynecology

## 2013-11-23 LAB — CYTOLOGY - PAP

## 2013-12-01 ENCOUNTER — Ambulatory Visit: Payer: BC Managed Care – PPO | Admitting: Physician Assistant

## 2013-12-07 ENCOUNTER — Encounter (HOSPITAL_COMMUNITY): Payer: Self-pay | Admitting: Emergency Medicine

## 2013-12-07 ENCOUNTER — Emergency Department (HOSPITAL_COMMUNITY)
Admission: EM | Admit: 2013-12-07 | Discharge: 2013-12-07 | Disposition: A | Discharge status: Home / Self Care | Payer: BC Managed Care – PPO | Attending: Emergency Medicine | Admitting: Emergency Medicine

## 2013-12-07 DIAGNOSIS — G40909 Epilepsy, unspecified, not intractable, without status epilepticus: Secondary | ICD-10-CM | POA: Diagnosis not present

## 2013-12-07 DIAGNOSIS — G8929 Other chronic pain: Secondary | ICD-10-CM | POA: Insufficient documentation

## 2013-12-07 DIAGNOSIS — R5383 Other fatigue: Secondary | ICD-10-CM | POA: Insufficient documentation

## 2013-12-07 DIAGNOSIS — F419 Anxiety disorder, unspecified: Secondary | ICD-10-CM | POA: Diagnosis not present

## 2013-12-07 DIAGNOSIS — R21 Rash and other nonspecific skin eruption: Secondary | ICD-10-CM | POA: Diagnosis not present

## 2013-12-07 DIAGNOSIS — M797 Fibromyalgia: Secondary | ICD-10-CM | POA: Diagnosis not present

## 2013-12-07 DIAGNOSIS — Z3202 Encounter for pregnancy test, result negative: Secondary | ICD-10-CM | POA: Insufficient documentation

## 2013-12-07 DIAGNOSIS — Z79899 Other long term (current) drug therapy: Secondary | ICD-10-CM | POA: Diagnosis not present

## 2013-12-07 DIAGNOSIS — Z931 Gastrostomy status: Secondary | ICD-10-CM | POA: Insufficient documentation

## 2013-12-07 DIAGNOSIS — F509 Eating disorder, unspecified: Secondary | ICD-10-CM | POA: Diagnosis present

## 2013-12-07 DIAGNOSIS — Z8659 Personal history of other mental and behavioral disorders: Secondary | ICD-10-CM

## 2013-12-07 DIAGNOSIS — J45909 Unspecified asthma, uncomplicated: Secondary | ICD-10-CM | POA: Diagnosis not present

## 2013-12-07 LAB — COMPREHENSIVE METABOLIC PANEL
ALT: 17 U/L (ref 0–35)
AST: 23 U/L (ref 0–37)
Albumin: 4.3 g/dL (ref 3.5–5.2)
Alkaline Phosphatase: 41 U/L (ref 39–117)
Anion gap: 12 (ref 5–15)
BILIRUBIN TOTAL: 0.2 mg/dL — AB (ref 0.3–1.2)
BUN: 9 mg/dL (ref 6–23)
CHLORIDE: 106 meq/L (ref 96–112)
CO2: 22 meq/L (ref 19–32)
Calcium: 9.5 mg/dL (ref 8.4–10.5)
Creatinine, Ser: 0.63 mg/dL (ref 0.50–1.10)
GFR calc Af Amer: >90 mL/min (ref >90)
GFR calc non Af Amer: >90 mL/min (ref >90)
Glucose, Bld: 86 mg/dL (ref 70–99)
POTASSIUM: 4 meq/L (ref 3.7–5.3)
Sodium: 140 mEq/L (ref 137–147)
Total Protein: 7.4 g/dL (ref 6.0–8.3)

## 2013-12-07 LAB — URINALYSIS, ROUTINE W REFLEX MICROSCOPIC
BILIRUBIN URINE: NEGATIVE
Glucose, UA: NEGATIVE mg/dL
Hgb urine dipstick: NEGATIVE
Ketones, ur: NEGATIVE mg/dL
LEUKOCYTES UA: NEGATIVE
Nitrite: NEGATIVE
PH: 5.5 (ref 5.0–8.0)
Protein, ur: NEGATIVE mg/dL
Specific Gravity, Urine: 1.005 (ref 1.005–1.030)
Urobilinogen, UA: 0.2 mg/dL (ref 0.0–1.0)

## 2013-12-07 LAB — CBC
HCT: 39 % (ref 36.0–46.0)
Hemoglobin: 13.1 g/dL (ref 12.0–15.0)
MCH: 31 pg (ref 26.0–34.0)
MCHC: 33.6 g/dL (ref 30.0–36.0)
MCV: 92.2 fL (ref 78.0–100.0)
PLATELETS: 172 10*3/uL (ref 150–400)
RBC: 4.23 MIL/uL (ref 3.87–5.11)
RDW: 13.5 % (ref 11.5–15.5)
WBC: 3.6 10*3/uL — AB (ref 4.0–10.5)

## 2013-12-07 LAB — PREGNANCY, URINE: Preg Test, Ur: NEGATIVE

## 2013-12-07 MED ORDER — SODIUM CHLORIDE 0.9 % IV BOLUS (SEPSIS)
500.0000 mL | Freq: Once | INTRAVENOUS | Status: AC
  Administered 2013-12-07: 500 mL via INTRAVENOUS

## 2013-12-07 NOTE — ED Notes (Signed)
Pt observed banging on the door, when asked what does she need she states that IV fluids are causing her pain, however IV line was closed and fluids were running. Pt then started crying loudly that we are not doing anything to help her and that she came for feeding tube and we still didn't put one in. PA made aware.

## 2013-12-07 NOTE — ED Notes (Addendum)
Pt states that she is here for feeding tube placement to help stabilizer her for her eating disorder, pt not only anorexia but states she also throws her food up as well.  Pt been battling eating disorder for 2 years. Pt also states that she has a rash

## 2013-12-07 NOTE — ED Notes (Signed)
Pt pulled her IV out and left prior to discharge; she refused vital signs

## 2013-12-07 NOTE — Discharge Instructions (Signed)
Call for a follow up appointment with a Family or Primary Care Provider (see list). Call a dermatologist for further evaluation of your rash. Call [facility] for further evaluation of your eating disorder. Follow up with your nutritionist.  Return if Symptoms worsen.   Take medication as prescribed.   Emergency Department Resource Guide 1) Find a Doctor and Pay Out of Pocket Although you won't have to find out who is covered by your insurance plan, it is a good idea to ask around and get recommendations. You will then need to call the office and see if the doctor you have chosen will accept you as a new patient and what types of options they offer for patients who are self-pay. Some doctors offer discounts or will set up payment plans for their patients who do not have insurance, but you will need to ask so you aren't surprised when you get to your appointment.  2) Contact Your Local Health Department Not all health departments have doctors that can see patients for sick visits, but many do, so it is worth a call to see if yours does. If you don't know where your local health department is, you can check in your phone book. The CDC also has a tool to help you locate your state's health department, and many state websites also have listings of all of their local health departments.  3) Find a Walk-in Clinic If your illness is not likely to be very severe or complicated, you may want to try a walk in clinic. These are popping up all over the country in pharmacies, drugstores, and shopping centers. They're usually staffed by nurse practitioners or physician assistants that have been trained to treat common illnesses and complaints. They're usually fairly quick and inexpensive. However, if you have serious medical issues or chronic medical problems, these are probably not your best option.  No Primary Care Doctor: - Call Health Connect at  [phone] - they can help you locate a primary care doctor  that  accepts your insurance, provides certain services, etc. - Physician Referral Service- [phone]  Chronic Pain Problems: Organization         Address  Phone   Notes  [facility]  [phone]) [phone] Patients need to be referred by their primary care doctor.   Medication Assistance: Organization         Address  Phone   Notes  [facility] Medication [facility] [address] [place], [place] [address] [phone]) [phone] --Must be a resident of [place] -- Must have NO insurance coverage whatsoever (no Medicaid/ Medicare, etc.) -- The pt. MUST have a primary care doctor that directs their care regularly and follows them in the community   MedAssist  [phone]   [employer]  [phone]    Agencies that provide inexpensive medical care: Organization         Address  Phone   Notes  [facility]  [phone]) [phone]   [facility]    [phone]) [phone]   [facility] [address] [place], [place] [address] [phone]   [facility] [address], [place] [phone](336) [phone]   Planned Parenthood    [phone]   [facility]    [phone](336) [phone]   [facility]  [address], [place] Phone:  [phone], Fax:  [phone](336) [phone] Hours of Operation:  9  am - 6 pm, M-F.  Also accepts Medicaid/Medicare and self-pay.  [facility]  [address], [place] Phone: [phone]) [phone], Fax: [phone]) [phone]. Hours of Operation:  8:30 am - 5:30 pm, M-F.  Also accepts Medicaid and self-pay.  [facility] [place] [address], [place] Phone: [phone](336) [phone]   [facility] [address], [place], [place] [phone], Ext. [phone] Mondays & Thursdays: 7-9 AM.  First 15 patients are seen on a first come, first serve basis.    Medicaid-accepting [facility] Providers:  Organization          Address  Phone   Notes  [facility] [address], [place] [phone] Also accepts self-pay patients.  [facility] [address], [place]  [phone]) [phone]   [facility] [address], [place] [phone]   [facility] [address] [place] [address], [place] [phone]) [phone]   Renaye RakersVeita Bland [address], [place]   [phone] Only accepts [place] Access [place] patients after they have their name applied to their card.   Self-Pay (no insurance) in [facility] [place]:  Organization         Address  Phone   Notes  Sickle Cell Patients, [facility] [address], [place] [phone]) [phone]   [facility] [address], [place] [phone]   [facility] [place]  [address], [place] [phone]   [facility]/Dr. Osei-Bonsu  [address] [place] [address], [place] or [phone], [place] [phone] Phone number for both [place] and [place] locations is the same.  [facility] [address], [place] [phone]) [phone]   [facility] [place] [address] [place] [address], [place] or [address] [phone]   [facility] [address], [place] [phone](336) [phone], phone; [phone], fax Sees patients 1st and 3rd Saturday of every month.  Must not qualify for public or private insurance (i.e. Medicaid, Medicare, [place] Health Choice, Veterans' Benefits)  Household income should be no more than 200% of the poverty level The clinic cannot treat you if you are pregnant or think you are pregnant  Sexually transmitted diseases are not treated at the clinic.    Dental Care: Organization         Address  Phone  Notes  [facility] Department of [facility] [address], [place] [phone] Accepts children up to age 23 who are enrolled in [place] or [place] Health Choice; pregnant women with a Medicaid card; and children who have applied for Medicaid or [place] Health Choice, but were declined, whose parents can pay a reduced fee at time of service.  [facility] Department of [facility] [place]  [address], [place] [phone]) [phone] Accepts children up to age 23 who are enrolled in [place]icaid or [place] Health Choice; pregnant women with a Medicaid card; and children who have applied for Medicaid or [place] Health Choice, but were declined, whose parents can pay a reduced fee at time of service.  [facility]  [address], [place] [phone] Patients are seen by appointment only. Walk-ins are not accepted. [facility] will see patients 18 years of  age and older. Monday - Tuesday (8am-5pm) Most Wednesdays (8:30-5pm) $30 per visit, cash only  [facility] Adult Dental Access PROGRAM  [address], [facility] [phone] Patients are seen by appointment only. Walk-ins are not accepted. [place] will see patients 57 years of age and older. One Wednesday Evening (Monthly: Volunteer Based).  $30 per visit, cash only  [place]  [phone] for adults; Children under age 78, call Graduate Pediatric Dentistry at [phone]. Children aged 43-14, please call [phone] to request a pediatric application.  Dental services are provided in all areas of dental care including fillings, crowns and bridges, complete and partial dentures, implants, gum treatment, root canals, and extractions. Preventive care is also provided. Treatment is provided to both adults and children. Patients are selected via a lottery and there is often a waiting list.   [facility] [address] [place]  [phone] [email or web address]   [facility] [address] [place], [place]  [phone], Ext. [phone] Second and Fourth Thursday of each month, opens at 6:30 AM; Clinic ends at 9 AM.  Patients are seen on a first-come first-served basis, and a limited number are seen during each clinic.   [facility]  [address] Hillard Danker [place], [place] [phone]   Eligibility Requirements You must have lived in [place], [place], or [place] counties for at least the last three months.   You cannot be eligible for state or federal sponsored [employer], including [employer], [place], or [employer].   You generally cannot be eligible for healthcare insurance through your employer.    How to apply: Eligibility screenings are held every Tuesday and Wednesday afternoon from 1:00 pm until 4:00 pm. You do not need an appointment for the interview!  [facility] [address] [place], [place]   [place]  [place] Department  [place]  [phone]    Behavioral Health Resources in the Community: Intensive Outpatient Programs Organization         Address  Phone  Notes  [place]. [address], [place], [place] [phone]   [facility] Outpatient [address], [place], [place]   ADS: Alcohol & Drug Svcs [address] [place], [place]   [place] [address],  [place], [place] or [phone]   Substance Abuse Resources Organization         Address  Phone  Notes  Alcohol and Drug Services  [phone]   [place]  [phone]   The [place]   Chinita Pester  [phone]   Residential & Outpatient Substance Abuse Program  [phone]   Psychological Services Organization         Address  Phone  Notes  [facility] [place]  [place]  [phone]    [facility] [address] [place] or [phone]    Mobile Crisis Teams Organization         Address  Phone  Notes  [facility], Mobile Crisis Care Unit  [phone]   Assertive Psychotherapeutic Services  [address] [place], [place]   [facility] [address] [place] [phone]    Self-Help/Support Groups Organization  Address  Phone             Notes  [place]. of [place] - variety of support groups  [place] Call for more information  Narcotics Anonymous (NA), [facility] [address], [employer] [place]  2 meetings at this location   Special educational needs teacher         Address  Phone  Notes  ASAP Residential Treatment [place],    [place]  [phone]   [facility]  [address], [place] [phone], [place], [place]   [place], [place] [phone] Admissions: 8am-3pm M-F  Incentives Substance [place] [address]    [place], [place] [phone]   [facility] [address] [place], [place], [place]   [facility] [address],  [place], [place]   Insight Programs - Intensive Outpatient [address], [place], [place]   [facility] [place].,  [place], [place] [phone] or [phone]   [facility] Services (RTS) [address], [place], [place] Accepts Medicaid  [facility] [place] [address],  [place] [phone] Substance Abuse/Addiction Treatment   [facility] Organization         Address  Phone  Notes  [facility]  [phone]   Domenic Schwab, PhD [address]lis Porta [place], [place]   [phone] or [phone]   Tyler  [address] [place], [place] [phone]   [facility] [address], [place], [place] [phone] Insurance/Medicaid/sponsorship through [facility] and Families [address], [address]                                    [place], [place] [phone] [place] [address], [place] [phone]    Dr. Adele Schilder  [phone]   [facility] [place] Dept. 1) [address] [place] 2) [place] 3)  [place] [address], [place] [phone]  [phone]   [place] [phone] or [phone] (After Hours)

## 2013-12-07 NOTE — ED Notes (Signed)
Pt reports rash on bilateral hips, sts it's painful at times, she also reports her therapist sent her here for feeding tube placement because she is purging. Pt is avoiding eye contact and has headphones in the entire time and is very reluctant in answering questions.

## 2013-12-07 NOTE — ED Provider Notes (Signed)
CSN: [id number]     Arrival date & time 12/07/13  1207 History   First MD Initiated Contact with Patient 12/07/13 1222     Chief Complaint  Patient presents with  . Eating Disorder  . Rash     (Consider location/radiation/quality/duration/timing/severity/associated sxs/prior Treatment) HPI Comments: The patient is a 23 year old female with past history of anorexia, fibromyalgia presenting to the emergency room chief complaint of worsening anorexia, requesting feeding tube placement. The patient states she was told to "get checked out" by her "therapist" today. She reports generalized weakness, worsening over the last several weeks. She reports last oral intake yesterday. reports history of purging, no recent vomiting.Reports history of eating disorder for over 2 years. When asked about pain the patient reports, I hurt all over, all the time, I have fibromyalgia. Reports bilateral hip rash onset 2 weeks ago with new medicaiton, reports no relief with "prescription cream", vistaril, and benadryl. She reports associated erythema. No recent contact irritation. No PCP. Reports following nutritionist. BH: Ashley MurrainHeidi Woodgeard  The history is provided by the patient. No language interpreter was used.    Past Medical History  Diagnosis Date  . Asthma   . Anorexia   . Fibromyalgia   . Seizures   . Anxiety   . Depression   . Eating disorder   . Chronic pain    Past Surgical History  Procedure Laterality Date  . Tympanostomy tube placement     Family History  Problem Relation Age of Onset  . Alcohol abuse Father    History  Substance Use Topics  . Smoking status: Never Smoker   . Smokeless tobacco: Not on file  . Alcohol Use: No   OB History    No data available     Review of Systems  Constitutional: Positive for fatigue. Negative for fever and appetite change.  Eyes: Negative for photophobia.  Gastrointestinal: Negative for nausea, vomiting, abdominal pain, diarrhea and  constipation.  Neurological: Negative for light-headedness.      Allergies  Rexulti  Home Medications   Prior to Admission medications   Medication Sig Start Date End Date Taking? Authorizing Provider  clonazePAM (KLONOPIN) 0.5 MG tablet Take 1 tablet by mouth 3 (three) times daily as needed for anxiety.  05/25/13  Yes Historical Provider, MD  desipramine (NORPRAMIN) 50 MG tablet Take 50 mg by mouth at bedtime.    Historical Provider, MD  gabapentin (NEURONTIN) 600 MG tablet Take 600 mg by mouth 3 (three) times daily.  08/01/13   Historical Provider, MD  ibuprofen (ADVIL,MOTRIN) 200 MG tablet Take 400 mg by mouth every 6 (six) hours as needed for mild pain.    Historical Provider, MD  levonorgestrel (MIRENA) 20 MCG/24HR IUD 1 each by Intrauterine route continuous.     Historical Provider, MD  oxyCODONE-acetaminophen (PERCOCET/ROXICET) 5-325 MG per tablet Take 2 tablets by mouth every 4 (four) hours as needed for severe pain. 10/26/13   Louann SjogrenVictoria L Creech, PA-C  traZODone (DESYREL) 100 MG tablet Take 100-300 mg by mouth at bedtime.     Historical Provider, MD   BP 115/76 mmHg  Pulse 76  Temp(Src) 97.8 F (36.6 C) (Oral)  Resp 16  SpO2 100% Physical Exam  Constitutional: She is oriented to person, place, and time. She appears well-developed. No distress.  HENT:  Head: Normocephalic and atraumatic.  Eyes: No scleral icterus.  Neck: Neck supple.  Cardiovascular: Normal rate and regular rhythm.   Pulmonary/Chest: Effort normal and breath sounds normal. She has no  wheezes. She has no rales.  Abdominal: Soft. There is no tenderness. There is no rebound and no guarding.  Neurological: She is alert and oriented to person, place, and time.  Skin: Skin is warm and dry. Rash noted.  Multiple raised scab lesions to bilateral hips, no surrounding erythema or signs of infection.  Psychiatric: Her speech is normal.  Poor eye contact.  Nursing note and vitals reviewed.   ED Course  Procedures  (including critical care time) Labs Review Labs Reviewed  CBC - Abnormal; Notable for the following:    WBC 3.6 (*)    All other components within normal limits  COMPREHENSIVE METABOLIC PANEL - Abnormal; Notable for the following:    Total Bilirubin 0.2 (*)    All other components within normal limits  URINALYSIS, ROUTINE W REFLEX MICROSCOPIC  PREGNANCY, URINE    Imaging Review No results found.   EKG Interpretation None      MDM   Final diagnoses:  Other fatigue  History of eating disorder  Rash   Patient presents with generalized weakness, reports ongoing for several months. Also has a history of anorexia. Patient is requesting be evaluated for a feeding tube placement. Patient also complains of rash to bilateral hips, present for 2 weeks reports onset after medication has since stopped. Has had multiple therapies in the past. Plan to follow-up with dermatology. Labs unrevealing for anemia, blood right and bounces to cause generalized fatigue.  UA without signs of infection, negative urine pregnancy. Reevaluation patient resting comfortably in room, with ear buds in ears. Discussed following up as an outpatient for evaluation of feeding tube placement, dermatologist.Reports no resolution of symptoms reports her skin is "crawling" and she feels worse. Patient began crying and states "I don't why she sent me here, there is nothing you all can do". Discussed at length ongoing symptoms for months, need to follow up as out-patient, and will discuss with her behavioral health specialist. 1445: discussed patient history, condition, labs with Ashley MurrainHeidi Woodgeard (not BH specialist, CSW) discussed follow-up outpatient for all complaints, nutrition S, GI, PCP, dermatologist. Mrs. Woodgeard plan to arrange for a ride home for patient. Patient apparently pulled out her own IV, left the ED, prior to receiving discharge paperwork.   Mellody DrownLauren Nuria Phebus, PA-C 12/07/13 1620  Enid SkeensJoshua M Zavitz,  MD 12/09/13 [phone]

## 2013-12-30 ENCOUNTER — Encounter (HOSPITAL_COMMUNITY): Payer: Self-pay | Admitting: *Deleted

## 2013-12-30 ENCOUNTER — Emergency Department (HOSPITAL_COMMUNITY)
Admission: EM | Admit: 2013-12-30 | Discharge: 2014-01-01 | Disposition: A | Discharge status: Home / Self Care | Payer: BC Managed Care – PPO | Attending: Emergency Medicine | Admitting: Emergency Medicine

## 2013-12-30 ENCOUNTER — Emergency Department (HOSPITAL_COMMUNITY): Payer: BC Managed Care – PPO

## 2013-12-30 ENCOUNTER — Ambulatory Visit (INDEPENDENT_AMBULATORY_CARE_PROVIDER_SITE_OTHER): Payer: BC Managed Care – PPO | Admitting: Internal Medicine

## 2013-12-30 VITALS — BP 106/70 | HR 76 | Temp 97.8°F | Resp 16

## 2013-12-30 DIAGNOSIS — G8929 Other chronic pain: Secondary | ICD-10-CM | POA: Diagnosis not present

## 2013-12-30 DIAGNOSIS — Z8739 Personal history of other diseases of the musculoskeletal system and connective tissue: Secondary | ICD-10-CM | POA: Insufficient documentation

## 2013-12-30 DIAGNOSIS — S0993XA Unspecified injury of face, initial encounter: Secondary | ICD-10-CM | POA: Diagnosis present

## 2013-12-30 DIAGNOSIS — Y929 Unspecified place or not applicable: Secondary | ICD-10-CM | POA: Diagnosis not present

## 2013-12-30 DIAGNOSIS — W1839XA Other fall on same level, initial encounter: Secondary | ICD-10-CM | POA: Insufficient documentation

## 2013-12-30 DIAGNOSIS — Z791 Long term (current) use of non-steroidal anti-inflammatories (NSAID): Secondary | ICD-10-CM | POA: Diagnosis not present

## 2013-12-30 DIAGNOSIS — F329 Major depressive disorder, single episode, unspecified: Secondary | ICD-10-CM | POA: Insufficient documentation

## 2013-12-30 DIAGNOSIS — S0083XA Contusion of other part of head, initial encounter: Secondary | ICD-10-CM | POA: Insufficient documentation

## 2013-12-30 DIAGNOSIS — F419 Anxiety disorder, unspecified: Secondary | ICD-10-CM | POA: Insufficient documentation

## 2013-12-30 DIAGNOSIS — G40909 Epilepsy, unspecified, not intractable, without status epilepticus: Secondary | ICD-10-CM | POA: Diagnosis not present

## 2013-12-30 DIAGNOSIS — Y998 Other external cause status: Secondary | ICD-10-CM | POA: Insufficient documentation

## 2013-12-30 DIAGNOSIS — J45909 Unspecified asthma, uncomplicated: Secondary | ICD-10-CM | POA: Insufficient documentation

## 2013-12-30 DIAGNOSIS — R410 Disorientation, unspecified: Secondary | ICD-10-CM

## 2013-12-30 DIAGNOSIS — Z79899 Other long term (current) drug therapy: Secondary | ICD-10-CM | POA: Diagnosis not present

## 2013-12-30 DIAGNOSIS — S0180XA Unspecified open wound of other part of head, initial encounter: Secondary | ICD-10-CM

## 2013-12-30 DIAGNOSIS — Y9389 Activity, other specified: Secondary | ICD-10-CM | POA: Insufficient documentation

## 2013-12-30 DIAGNOSIS — S060X1A Concussion with loss of consciousness of 30 minutes or less, initial encounter: Secondary | ICD-10-CM

## 2013-12-30 DIAGNOSIS — W19XXXA Unspecified fall, initial encounter: Secondary | ICD-10-CM

## 2013-12-30 LAB — CBC
HCT: 35.3 % — ABNORMAL LOW (ref 36.0–46.0)
Hemoglobin: 12 g/dL (ref 12.0–15.0)
MCH: 30.8 pg (ref 26.0–34.0)
MCHC: 34 g/dL (ref 30.0–36.0)
MCV: 90.5 fL (ref 78.0–100.0)
Platelets: 179 10*3/uL (ref 150–400)
RBC: 3.9 MIL/uL (ref 3.87–5.11)
RDW: 12.7 % (ref 11.5–15.5)
WBC: 4.7 10*3/uL (ref 4.0–10.5)

## 2013-12-30 LAB — POCT CBC
Granulocyte percent: 44.6 %G (ref 37–80)
HEMATOCRIT: 38.2 % (ref 37.7–47.9)
Hemoglobin: 12.9 g/dL (ref 12.2–16.2)
LYMPH, POC: 2.7 (ref 0.6–3.4)
MCH, POC: 31.4 pg — AB (ref 27–31.2)
MCHC: 33.7 g/dL (ref 31.8–35.4)
MCV: 93.1 fL (ref 80–97)
MID (CBC): 0.4 (ref 0–0.9)
MPV: 8.1 fL (ref 0–99.8)
PLATELET COUNT, POC: 201 10*3/uL (ref 142–424)
POC GRANULOCYTE: 2.5 (ref 2–6.9)
POC LYMPH PERCENT: 47.5 %L (ref 10–50)
POC MID %: 7.9 %M (ref 0–12)
RBC: 4.1 M/uL (ref 4.04–5.48)
RDW, POC: 13 %
WBC: 5.6 10*3/uL (ref 4.6–10.2)

## 2013-12-30 LAB — BASIC METABOLIC PANEL
Anion gap: 12 (ref 5–15)
BUN: 8 mg/dL (ref 6–23)
CALCIUM: 8.9 mg/dL (ref 8.4–10.5)
CO2: 24 meq/L (ref 19–32)
CREATININE: 0.56 mg/dL (ref 0.50–1.10)
Chloride: 106 mEq/L (ref 96–112)
GFR calc Af Amer: >90 mL/min (ref >90)
GLUCOSE: 84 mg/dL (ref 70–99)
Potassium: 3.9 mEq/L (ref 3.7–5.3)
Sodium: 142 mEq/L (ref 137–147)

## 2013-12-30 LAB — GLUCOSE, POCT (MANUAL RESULT ENTRY): POC Glucose: 89 mg/dl (ref 70–99)

## 2013-12-30 MED ORDER — NAPROXEN 500 MG PO TABS: 500.0000 mg | ORAL_TABLET | Freq: Two times a day (BID) | ORAL | Status: DC

## 2013-12-30 MED ORDER — SODIUM CHLORIDE 0.9 % IV SOLN
Freq: Once | INTRAVENOUS | Status: AC
  Administered 2013-12-30: 09:00:00 via INTRAVENOUS

## 2013-12-30 NOTE — ED Notes (Signed)
Patient transported to CT 

## 2013-12-30 NOTE — ED Notes (Signed)
Pt arrived by [facility] for a fall that occurred at 0630. Pt went to [facility] and sent here due to pt being sluggish and slow to answer questions. Has neck pain and hematoma to right head,.

## 2013-12-30 NOTE — Discharge Instructions (Signed)
Contusion °A contusion is a deep bruise. Contusions are the result of an injury that caused bleeding under the skin. The contusion may turn blue, purple, or yellow. Minor injuries will give you a painless contusion, but more severe contusions may stay painful and swollen for a few weeks.  °CAUSES  °A contusion is usually caused by a blow, trauma, or direct force to an area of the body. °SYMPTOMS  °· Swelling and redness of the injured area. °· Bruising of the injured area. °· Tenderness and soreness of the injured area. °· Pain. °DIAGNOSIS  °The diagnosis can be made by taking a history and physical exam. An X-ray, CT scan, or MRI may be needed to determine if there were any associated injuries, such as fractures. °TREATMENT  °Specific treatment will depend on what area of the body was injured. In general, the best treatment for a contusion is resting, icing, elevating, and applying cold compresses to the injured area. Over-the-counter medicines may also be recommended for pain control. Ask your caregiver what the best treatment is for your contusion. °HOME CARE INSTRUCTIONS  °· Put ice on the injured area. °¨ Put ice in a plastic bag. °¨ Place a towel between your skin and the bag. °¨ Leave the ice on for 15-20 minutes, 3-4 times a day, or as directed by your health care provider. °· Only take over-the-counter or prescription medicines for pain, discomfort, or fever as directed by your caregiver. Your caregiver may recommend avoiding anti-inflammatory medicines (aspirin, ibuprofen, and naproxen) for 48 hours because these medicines may increase bruising. °· Rest the injured area. °· If possible, elevate the injured area to reduce swelling. °SEEK IMMEDIATE MEDICAL CARE IF:  °· You have increased bruising or swelling. °· You have pain that is getting worse. °· Your swelling or pain is not relieved with medicines. °MAKE SURE YOU:  °· Understand these instructions. °· Will watch your condition. °· Will get help right  away if you are not doing well or get worse. °Document Released: 10/16/2004 Document Revised: 01/11/2013 Document Reviewed: 11/11/2010 °[employer]® Patient Information ©2015 [employer]. This information is not intended to replace advice given to you by your health care provider. Make sure you discuss any questions you have with your health care provider. ° °

## 2013-12-30 NOTE — Progress Notes (Signed)
   Subjective:    Patient ID: Terry Schneider, female    DOB: August 08, 1990, 23 y.o.   MRN: [id number]  HPI This is an emergency!  Larey SeatFell hit right face carrying Xmas presents. Did not break fall with hands. Only meds are Klonopin and gabapentin. Did not take either today. No alcohol use, friend with her. Also has Anorexia nervosa. She admits to mild confusion , feels like she is fading out, speech is coming with difficulty. She can walk but is unsteady.Getting worse, sensorium more cloudy, cannot open eyes.   Review of Systems No primary doc, Dr. Evelene CroonKaur is her Psychiatrist.    Objective:   Physical Exam  Constitutional: She appears lethargic. She appears distressed.  HENT:  Head:    Right Ear: External ear normal.  Left Ear: External ear normal.  Mouth/Throat: Uvula is midline. Normal dentition.  Wound right eyebrow Hematoma surrounding  Teeth not loose or chipped.  Eyes: EOM are normal. Pupils are equal, round, and reactive to light.    Unable to keep eyes open  Neck: Spinous process tenderness and muscular tenderness present. Decreased range of motion present.  Pulmonary/Chest: Effort normal.  Abdominal: There is no tenderness.  Musculoskeletal: She exhibits edema and tenderness.  Lymphadenopathy:    She has no cervical adenopathy.  Neurological: She has normal reflexes. She appears lethargic. A cranial nerve deficit is present. No sensory deficit. She exhibits abnormal muscle tone. Coordination and gait abnormal. GCS eye subscore is 3. GCS verbal subscore is 4. GCS motor subscore is 5. She displays no Babinski's sign on the right side. She displays no Babinski's sign on the left side.  confusion    stat glucose 87 Results for orders placed or performed in visit on 12/30/13  POCT CBC  Result Value Ref Range   WBC 5.6 4.6 - 10.2 K/uL   Lymph, poc 2.7 0.6 - 3.4   POC LYMPH PERCENT 47.5 10 - 50 %L   MID (cbc) 0.4 0 - 0.9   POC MID % 7.9 0 - 12 %M   POC Granulocyte 2.5 2 -  6.9   Granulocyte percent 44.6 37 - 80 %G   RBC 4.10 4.04 - 5.48 M/uL   Hemoglobin 12.9 12.2 - 16.2 g/dL   HCT, POC 40.938.2 81.137.7 - 47.9 %   MCV 93.1 80 - 97 fL   MCH, POC 31.4 (A) 27 - 31.2 pg   MCHC 33.7 31.8 - 35.4 g/dL   RDW, POC 91.413.0 %   Platelet Count, POC 201 142 - 424 K/uL   MPV 8.1 0 - 99.8 fL  POCT glucose (manual entry)  Result Value Ref Range   POC Glucose 89 70 - 99 mg/dl         Assessment & Plan:  Concussion/Confusion Wound face/Hematoma face Decreased consciousness To [place] by EMT/Emergency

## 2013-12-30 NOTE — ED Provider Notes (Signed)
CSN: [id number]     Arrival date & time 12/30/13  [id number] History   First MD Initiated Contact with Patient 12/30/13 [id number]     Chief Complaint  Patient presents with  . Fall     (Consider location/radiation/quality/duration/timing/severity/associated sxs/prior Treatment) HPI Comments: The patient is a 23 year old female, history of eating disorder and generalized weakness, presents after having a fall this morning. She comes from the urgent care where she was seen initially and found to be sluggish and have a hematoma over her right forehead. She states that she was trying to carry Christmas presents, she does not know why she fell, she thinks it may be because she was carrying too much once but really does not know why. She denies chest pain shortness of breath back pain but does have neck pain headache and feels drowsy. Symptoms are persistent, nothing makes this better or worse, she has recently suffered a gastrointestinal illness with nausea and vomiting for the last 2 days, her boyfriend who she lives with has similar symptoms.  Patient is a 23 y.o. female presenting with fall. The history is provided by the patient.  Fall    Past Medical History  Diagnosis Date  . Asthma   . Anorexia   . Fibromyalgia   . Seizures   . Anxiety   . Depression   . Eating disorder   . Chronic pain    Past Surgical History  Procedure Laterality Date  . Tympanostomy tube placement     Family History  Problem Relation Age of Onset  . Alcohol abuse Father    History  Substance Use Topics  . Smoking status: Never Smoker   . Smokeless tobacco: Not on file  . Alcohol Use: No   OB History    No data available     Review of Systems  All other systems reviewed and are negative.     Allergies  Rexulti  Home Medications   Prior to Admission medications   Medication Sig Start Date End Date Taking? Authorizing Provider  clonazePAM (KLONOPIN) 0.5 MG tablet Take 1 tablet by mouth 3 (three)  times daily as needed for anxiety.  05/25/13  Yes Historical Provider, MD  gabapentin (NEURONTIN) 600 MG tablet Take 600 mg by mouth 3 (three) times daily.  08/01/13  Yes Historical Provider, MD  ibuprofen (ADVIL,MOTRIN) 200 MG tablet Take 400 mg by mouth every 6 (six) hours as needed for mild pain.   Yes Historical Provider, MD  traZODone (DESYREL) 100 MG tablet Take 300 mg by mouth 3 (three) times daily.    Yes Historical Provider, MD  naproxen (NAPROSYN) 500 MG tablet Take 1 tablet (500 mg total) by mouth 2 (two) times daily with a meal. 12/30/13   Vida RollerBrian D Brad Lieurance, MD   BP 109/70 mmHg  Pulse 71  Temp(Src) 98.4 F (36.9 C) (Oral)  Resp 17  SpO2 100% Physical Exam  Constitutional: She appears well-developed and well-nourished. No distress.  HENT:  Head: Normocephalic.  Mouth/Throat: Oropharynx is clear and moist. No oropharyngeal exudate.  No hemotympanum, hematoma overlying the right forehead and periorbital area, no tenderness over the nasal bridge, no malocclusion, no battle sign  Eyes: Conjunctivae and EOM are normal. Pupils are equal, round, and reactive to light. Right eye exhibits no discharge. Left eye exhibits no discharge. No scleral icterus.  Neck: Normal range of motion. Neck supple. No JVD present. No thyromegaly present.  Cardiovascular: Normal rate, regular rhythm, normal heart sounds and intact distal pulses.  Exam reveals no gallop and no friction rub.   No murmur heard. Pulmonary/Chest: Effort normal and breath sounds normal. No respiratory distress. She has no wheezes. She has no rales.  Abdominal: Soft. Bowel sounds are normal. She exhibits no distension and no mass. There is no tenderness.  Musculoskeletal: Normal range of motion. She exhibits no edema or tenderness.  Lymphadenopathy:    She has no cervical adenopathy.  Neurological: She is alert. Coordination normal.  Awake but sleepy appearing, speaks in full sentences.  She moves all 4 extremities, she has diffuse  generalized weakness, her pupils are 6 mm and minimally reactive bilaterally  Skin: Skin is warm and dry. No rash noted. No erythema.  Psychiatric: She has a normal mood and affect. Her behavior is normal.  Nursing note and vitals reviewed.   ED Course  Procedures (including critical care time) Labs Review Labs Reviewed  CBC - Abnormal; Notable for the following:    HCT 35.3 (*)    All other components within normal limits  BASIC METABOLIC PANEL    Imaging Review Ct Head Wo Contrast  12/30/2013   CLINICAL DATA:  23 year old female who fell at 0630 hr, decreased mental status, neck pain, right head hematoma. Initial encounter.  EXAM: CT HEAD WITHOUT CONTRAST  CT MAXILLOFACIAL WITHOUT CONTRAST  CT CERVICAL SPINE WITHOUT CONTRAST  TECHNIQUE: Multidetector CT imaging of the head, cervical spine, and maxillofacial structures were performed using the standard protocol without intravenous contrast. Multiplanar CT image reconstructions of the cervical spine and maxillofacial structures were also generated.  COMPARISON:  Head CTs without contrast 06/14/2013 and earlier.  FINDINGS: CT HEAD FINDINGS  No superior or posterior scalp hematoma identified. See facial findings below. Mastoids and tympanic cavities are clear. Calvarium intact.  Stable and normal cerebral volume. Incidental mega cisterna magna anatomic variant. No midline shift, ventriculomegaly, mass effect, evidence of mass lesion, intracranial hemorrhage or evidence of cortically based acute infarction. Gray-white matter differentiation is within normal limits throughout the brain. No suspicious intracranial vascular hyperdensity.  CT MAXILLOFACIAL FINDINGS  Right periorbital soft tissue hematoma measures up to 9 mm in thickness. Associated right premalar are soft tissue contusion. Underlying right globe is intact. No intraorbital soft tissue injury identified. Right orbital walls are intact.  Right zygoma intact. Paranasal sinuses are clear.  Nasal bones intact. Mandible intact. No acute facial fracture identified.  Normal left orbit. Visible non contrast deep soft tissue spaces of the face are within normal limits.  CT CERVICAL SPINE FINDINGS  Relatively preserved cervical lordosis. Visualized skull base is intact. No atlanto-occipital dissociation. Cervicothoracic junction alignment is within normal limits. Bilateral posterior element alignment is within normal limits. No acute cervical spine fracture identified. Negative lung apices. Visible upper thoracic levels appear intact. Negative non contrast paraspinal soft tissues.  IMPRESSION: 1. Right periorbital soft tissue injury without acute facial fracture. No intraorbital injury. 2.  Normal noncontrast CT appearance of the brain. 3. No acute fracture or listhesis identified in the cervical spine. Ligamentous injury is not excluded.   Electronically Signed   By: Augusto Gamble M.D.   On: 12/30/2013 10:30   Ct Cervical Spine Wo Contrast  12/30/2013   CLINICAL DATA:  23 year old female who fell at 0630 hr, decreased mental status, neck pain, right head hematoma. Initial encounter.  EXAM: CT HEAD WITHOUT CONTRAST  CT MAXILLOFACIAL WITHOUT CONTRAST  CT CERVICAL SPINE WITHOUT CONTRAST  TECHNIQUE: Multidetector CT imaging of the head, cervical spine, and maxillofacial structures were performed using the standard  protocol without intravenous contrast. Multiplanar CT image reconstructions of the cervical spine and maxillofacial structures were also generated.  COMPARISON:  Head CTs without contrast 06/14/2013 and earlier.  FINDINGS: CT HEAD FINDINGS  No superior or posterior scalp hematoma identified. See facial findings below. Mastoids and tympanic cavities are clear. Calvarium intact.  Stable and normal cerebral volume. Incidental mega cisterna magna anatomic variant. No midline shift, ventriculomegaly, mass effect, evidence of mass lesion, intracranial hemorrhage or evidence of cortically based acute  infarction. Gray-white matter differentiation is within normal limits throughout the brain. No suspicious intracranial vascular hyperdensity.  CT MAXILLOFACIAL FINDINGS  Right periorbital soft tissue hematoma measures up to 9 mm in thickness. Associated right premalar are soft tissue contusion. Underlying right globe is intact. No intraorbital soft tissue injury identified. Right orbital walls are intact.  Right zygoma intact. Paranasal sinuses are clear. Nasal bones intact. Mandible intact. No acute facial fracture identified.  Normal left orbit. Visible non contrast deep soft tissue spaces of the face are within normal limits.  CT CERVICAL SPINE FINDINGS  Relatively preserved cervical lordosis. Visualized skull base is intact. No atlanto-occipital dissociation. Cervicothoracic junction alignment is within normal limits. Bilateral posterior element alignment is within normal limits. No acute cervical spine fracture identified. Negative lung apices. Visible upper thoracic levels appear intact. Negative non contrast paraspinal soft tissues.  IMPRESSION: 1. Right periorbital soft tissue injury without acute facial fracture. No intraorbital injury. 2.  Normal noncontrast CT appearance of the brain. 3. No acute fracture or listhesis identified in the cervical spine. Ligamentous injury is not excluded.   Electronically Signed   By: Augusto GambleLee  Hall M.D.   On: 12/30/2013 10:30   Ct Maxillofacial Wo Cm  12/30/2013   CLINICAL DATA:  23 year old female who fell at 0630 hr, decreased mental status, neck pain, right head hematoma. Initial encounter.  EXAM: CT HEAD WITHOUT CONTRAST  CT MAXILLOFACIAL WITHOUT CONTRAST  CT CERVICAL SPINE WITHOUT CONTRAST  TECHNIQUE: Multidetector CT imaging of the head, cervical spine, and maxillofacial structures were performed using the standard protocol without intravenous contrast. Multiplanar CT image reconstructions of the cervical spine and maxillofacial structures were also generated.   COMPARISON:  Head CTs without contrast 06/14/2013 and earlier.  FINDINGS: CT HEAD FINDINGS  No superior or posterior scalp hematoma identified. See facial findings below. Mastoids and tympanic cavities are clear. Calvarium intact.  Stable and normal cerebral volume. Incidental mega cisterna magna anatomic variant. No midline shift, ventriculomegaly, mass effect, evidence of mass lesion, intracranial hemorrhage or evidence of cortically based acute infarction. Gray-white matter differentiation is within normal limits throughout the brain. No suspicious intracranial vascular hyperdensity.  CT MAXILLOFACIAL FINDINGS  Right periorbital soft tissue hematoma measures up to 9 mm in thickness. Associated right premalar are soft tissue contusion. Underlying right globe is intact. No intraorbital soft tissue injury identified. Right orbital walls are intact.  Right zygoma intact. Paranasal sinuses are clear. Nasal bones intact. Mandible intact. No acute facial fracture identified.  Normal left orbit. Visible non contrast deep soft tissue spaces of the face are within normal limits.  CT CERVICAL SPINE FINDINGS  Relatively preserved cervical lordosis. Visualized skull base is intact. No atlanto-occipital dissociation. Cervicothoracic junction alignment is within normal limits. Bilateral posterior element alignment is within normal limits. No acute cervical spine fracture identified. Negative lung apices. Visible upper thoracic levels appear intact. Negative non contrast paraspinal soft tissues.  IMPRESSION: 1. Right periorbital soft tissue injury without acute facial fracture. No intraorbital injury. 2.  Normal noncontrast  CT appearance of the brain. 3. No acute fracture or listhesis identified in the cervical spine. Ligamentous injury is not excluded.   Electronically Signed   By: Augusto Gamble M.D.   On: 12/30/2013 10:30     EKG Interpretation   Date/Time:  Friday December 30 2013 09:11:48 EST Ventricular Rate:  61 PR  Interval:  143 QRS Duration: 87 QT Interval:  429 QTC Calculation: 432 R Axis:   84 Text Interpretation:  Sinus rhythm ST elev, probable normal early repol  pattern ECG OTHERWISE WITHIN NORMAL LIMITS Since last tracing rate slower  Confirmed by Karmella Bouvier  MD, Merwyn Hodapp ([id number]) on 12/30/2013 11:02:13 AM      MDM   Final diagnoses:  Fall  Hematoma of face, initial encounter    Denies overdose, denies substance abuse though she did state that she took her medications last night including Neurontin and clonazepam, check imaging, labs for generalized weakness.  CT scan negative for acute findings other than hematoma which is extracranial, patient informed of the results, cervical collar removed, family will observe her today, anti-inflammatories and ice pack.  Meds given in ED:  Medications  0.9 %  sodium chloride infusion ( Intravenous New Bag/Given 12/30/13 0910)    New Prescriptions   NAPROXEN (NAPROSYN) 500 MG TABLET    Take 1 tablet (500 mg total) by mouth 2 (two) times daily with a meal.       Vida Roller, MD 12/30/13 1102

## 2013-12-30 NOTE — ED Notes (Signed)
Ambulated pt in hallway. Pt said she felt steady and secure. Pt's gait appeared normal. Gave pt ice for right eye.

## 2014-01-11 ENCOUNTER — Emergency Department (HOSPITAL_COMMUNITY)
Admission: EM | Admit: 2014-01-11 | Discharge: 2014-01-11 | Discharge status: Against Medical Advice | Payer: BC Managed Care – PPO | Attending: Emergency Medicine | Admitting: Emergency Medicine

## 2014-01-11 ENCOUNTER — Encounter (HOSPITAL_COMMUNITY): Payer: Self-pay | Admitting: *Deleted

## 2014-01-11 DIAGNOSIS — R51 Headache: Secondary | ICD-10-CM | POA: Insufficient documentation

## 2014-01-11 DIAGNOSIS — J45909 Unspecified asthma, uncomplicated: Secondary | ICD-10-CM | POA: Insufficient documentation

## 2014-01-11 DIAGNOSIS — G8929 Other chronic pain: Secondary | ICD-10-CM | POA: Insufficient documentation

## 2014-01-11 NOTE — ED Notes (Signed)
Pt. Is upset because a EDP hasnt seen her yet. She called out asking to leave AMA. RN tried to calm pt. Down and locate an EDP, but they were all in pt. Rooms and RN informed pt. Of EDP locations. Pt. Still insistent on leaving AMA. Pt just stated she would call service excellence about her experience.

## 2014-01-11 NOTE — ED Notes (Signed)
Pt. Was seen in the ED over a week ago because her eye was swollen shut from falling down and hitting concrete. Pt. Was discharged home and told to come back if the pain got worse. Pt. States Pain hasnt gotten any better. Pt. Is unable to think and feels disoriented with this constant pain.

## 2014-01-19 ENCOUNTER — Ambulatory Visit: Payer: BC Managed Care – PPO | Admitting: Internal Medicine

## 2014-03-01 ENCOUNTER — Encounter: Payer: Self-pay | Admitting: Neurology

## 2014-03-03 ENCOUNTER — Encounter: Payer: Self-pay | Admitting: Neurology

## 2014-03-03 ENCOUNTER — Ambulatory Visit (INDEPENDENT_AMBULATORY_CARE_PROVIDER_SITE_OTHER): Payer: Self-pay | Admitting: Neurology

## 2014-03-03 VITALS — BP 117/75 | HR 79 | Temp 98.3°F | Ht 64.5 in | Wt 122.0 lb

## 2014-03-03 DIAGNOSIS — Z87820 Personal history of traumatic brain injury: Secondary | ICD-10-CM

## 2014-03-03 DIAGNOSIS — F4320 Adjustment disorder, unspecified: Secondary | ICD-10-CM

## 2014-03-03 DIAGNOSIS — R519 Headache, unspecified: Secondary | ICD-10-CM

## 2014-03-03 DIAGNOSIS — R51 Headache: Secondary | ICD-10-CM

## 2014-03-03 DIAGNOSIS — G8929 Other chronic pain: Secondary | ICD-10-CM

## 2014-03-03 DIAGNOSIS — R4589 Other symptoms and signs involving emotional state: Secondary | ICD-10-CM
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
## 2014-04-05 ENCOUNTER — Emergency Department (HOSPITAL_COMMUNITY)
Admission: EM | Admit: 2014-04-05 | Discharge: 2014-04-05 | Disposition: A | Discharge status: Home / Self Care | Payer: Medicaid Other | Attending: Emergency Medicine | Admitting: Emergency Medicine

## 2014-04-05 ENCOUNTER — Encounter (HOSPITAL_COMMUNITY): Payer: Self-pay | Admitting: Emergency Medicine

## 2014-04-05 DIAGNOSIS — R195 Other fecal abnormalities: Secondary | ICD-10-CM | POA: Diagnosis not present

## 2014-04-05 DIAGNOSIS — M5416 Radiculopathy, lumbar region: Secondary | ICD-10-CM | POA: Diagnosis not present

## 2014-04-05 DIAGNOSIS — M797 Fibromyalgia: Secondary | ICD-10-CM | POA: Diagnosis not present

## 2014-04-05 DIAGNOSIS — F329 Major depressive disorder, single episode, unspecified: Secondary | ICD-10-CM | POA: Diagnosis not present

## 2014-04-05 DIAGNOSIS — M1990 Unspecified osteoarthritis, unspecified site: Secondary | ICD-10-CM | POA: Diagnosis not present

## 2014-04-05 DIAGNOSIS — G8929 Other chronic pain: Secondary | ICD-10-CM | POA: Diagnosis not present

## 2014-04-05 DIAGNOSIS — IMO0001 Reserved for inherently not codable concepts without codable children: Secondary | ICD-10-CM

## 2014-04-05 DIAGNOSIS — G629 Polyneuropathy, unspecified: Secondary | ICD-10-CM | POA: Diagnosis not present

## 2014-04-05 DIAGNOSIS — M79604 Pain in right leg: Secondary | ICD-10-CM | POA: Diagnosis present

## 2014-04-05 DIAGNOSIS — Z791 Long term (current) use of non-steroidal anti-inflammatories (NSAID): Secondary | ICD-10-CM | POA: Diagnosis not present

## 2014-04-05 DIAGNOSIS — G40909 Epilepsy, unspecified, not intractable, without status epilepticus: Secondary | ICD-10-CM | POA: Diagnosis not present

## 2014-04-05 DIAGNOSIS — R109 Unspecified abdominal pain: Secondary | ICD-10-CM | POA: Diagnosis not present

## 2014-04-05 DIAGNOSIS — J45909 Unspecified asthma, uncomplicated: Secondary | ICD-10-CM | POA: Insufficient documentation

## 2014-04-05 DIAGNOSIS — F419 Anxiety disorder, unspecified: Secondary | ICD-10-CM | POA: Insufficient documentation

## 2014-04-05 DIAGNOSIS — Z79899 Other long term (current) drug therapy: Secondary | ICD-10-CM | POA: Diagnosis not present

## 2014-04-05 MED ORDER — HYDROCODONE-ACETAMINOPHEN 5-325 MG PO TABS: 1.0000 | ORAL_TABLET | Freq: Four times a day (QID) | ORAL | Status: DC | PRN

## 2014-04-05 MED ORDER — PREDNISONE 20 MG PO TABS: ORAL_TABLET | ORAL | Status: DC
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
## 2014-04-05 NOTE — Discharge Instructions (Signed)
Radicular Pain Radicular pain in either the arm or leg is usually from a bulging or herniated disk in the spine. A piece of the herniated disk may press against the nerves as the nerves exit the spine. This causes pain which is felt at the tips of the nerves down the arm or leg. Other causes of radicular pain may include:  Fractures.  Heart disease.  Cancer.  An abnormal and usually degenerative state of the nervous system or nerves (neuropathy). Diagnosis may require CT or MRI scanning to determine the primary cause.  Nerves that start at the neck (nerve roots) may cause radicular pain in the outer shoulder and arm. It can spread down to the thumb and fingers. The symptoms vary depending on which nerve root has been affected. In most cases radicular pain improves with conservative treatment. Neck problems may require physical therapy, a neck collar, or cervical traction. Treatment may take many weeks, and surgery may be considered if the symptoms do not improve.  Conservative treatment is also recommended for sciatica. Sciatica causes pain to radiate from the lower back or buttock area down the leg into the foot. Often there is a history of back problems. Most patients with sciatica are better after 2 to 4 weeks of rest and other supportive care. Short term bed rest can reduce the disk pressure considerably. Sitting, however, is not a good position since this increases the pressure on the disk. You should avoid bending, lifting, and all other activities which make the problem worse. Traction can be used in severe cases. Surgery is usually reserved for patients who do not improve within the first months of treatment. Only take over-the-counter or prescription medicines for pain, discomfort, or fever as directed by your caregiver. Narcotics and muscle relaxants may help by relieving more severe pain and spasm and by providing mild sedation. Cold or massage can give significant relief. Spinal  manipulation is not recommended. It can increase the degree of disc protrusion. Epidural steroid injections are often effective treatment for radicular pain. These injections deliver medicine to the spinal nerve in the space between the protective covering of the spinal cord and back bones (vertebrae). Your caregiver can give you more information about steroid injections. These injections are most effective when given within two weeks of the onset of pain.  You should see your caregiver for follow up care as recommended. A program for neck and back injury rehabilitation with stretching and strengthening exercises is an important part of management.  SEEK IMMEDIATE MEDICAL CARE IF:  You develop increased pain, weakness, or numbness in your arm or leg.  You develop difficulty with bladder or bowel control.  You develop abdominal pain. Document Released: 02/14/2004 Document Revised: 03/31/2011 Document Reviewed: 05/01/2008 [facility] Patient Information 2015 [employer] [place]. This information is not intended to replace advice given to you by your health care provider. Make sure you discuss any questions you have with your health care provider.   Emergency Department Resource Guide 1) Find a Doctor and Pay Out of Pocket Although you won't have to find out who is covered by your insurance plan, it is a good idea to ask around and get recommendations. You will then need to call the office and see if the doctor you have chosen will accept you as a new patient and what types of options they offer for patients who are self-pay. Some doctors offer discounts or will set up payment plans for their patients who do not have insurance, but you  will need to ask so you aren't surprised when you get to your appointment.  2) Contact Your Local Health Department Not all health departments have doctors that can see patients for sick visits, but many do, so it is worth a call to see if yours does. If you don't know where  your local health department is, you can check in your phone book. The CDC also has a tool to help you locate your state's health department, and many state websites also have listings of all of their local health departments.  3) Find a Walk-in Clinic If your illness is not likely to be very severe or complicated, you may want to try a walk in clinic. These are popping up all over the country in pharmacies, drugstores, and shopping centers. They're usually staffed by nurse practitioners or physician assistants that have been trained to treat common illnesses and complaints. They're usually fairly quick and inexpensive. However, if you have serious medical issues or chronic medical problems, these are probably not your best option.  No Primary Care Doctor: - Call Health Connect at  [phone] - they can help you locate a primary care doctor that  accepts your insurance, provides certain services, etc. - Physician Referral Service- [phone]  Chronic Pain Problems: Organization         Address  Phone   Notes  [facility]  [phone] Patients need to be referred by their primary care doctor.   Medication Assistance: Organization         Address  Phone   Notes  [facility] Medication [facility] [address] [place] [place], [place] [address] [phone] --Must be a resident of [facility] -- Must have NO insurance coverage whatsoever (no Medicaid/ Medicare, etc.) -- The pt. MUST have a primary care doctor that directs their care regularly and follows them in the community   [facility]  [phone]   [employer]  [phone]    Agencies that provide inexpensive medical care: Organization         Address  Phone   Notes  [facility]  [phone]   [facility]    [phone]   [facility] [address] [place], [place] [address] [phone]   [facility] [address]  [place], [place] [phone]   Planned Parenthood    [phone]   [facility]    [phone]   [facility]  [address], [place] Phone:  [phone], Fax:  [phone] Hours of Operation:  9 am - 6 pm, M-F.  Also accepts Medicaid/Medicare and self-pay.  [facility]  [address], [place] Phone: [phone], Fax: [phone]. Hours of Operation:  8:30 am - 5:30 pm, M-F.  Also accepts Medicaid and self-pay.  [facility] [place] [address] [place] Phone: [phone]   [facility] [address] Natasha Bence [place], [place] [phone], Ext. [phone] Mondays & Thursdays: 7-9 AM.  First 15 patients are seen on a first come, first serve basis.    Medicaid-accepting [facility] Providers:  Organization         Address  Phone   Notes  [facility] [address], [place] [phone] Also accepts self-pay patients.  [facility] [address] [place], [place] [address],  [place]  [phone]   [facility] [address], [place] [phone]   [facility] [address] [place] [phone]   Renaye Rakers [address], [place]   [phone] Only accepts [place] Access [place] patients after they have their name applied to their card.   Self-Pay (no insurance) in [facility]:  Organization         Address  Phone   Notes  Sickle Cell Patients, [facility] [address] [place], [place] [phone]   [facility] [address] [place], [place] [phone]   [facility] [place]  [address], [place] [phone]   [facility]/Dr. Osei-Bonsu  [address], [place] or [address], [place] [phone] Phone number for both [place] and [place] locations is the same.  [facility] [address] [place] [phone]   [facility] [address], [place] or [address] [phone]   [facility] [address] [place] [phone], phone; [phone], fax Sees patients 1st and 3rd Saturday of every month.  Must not qualify for public or private insurance (i.e. Medicaid, Medicare, [place] Health Choice, Veterans' Benefits)  Household income should be no more than 200% of the poverty level The clinic cannot treat you if you are pregnant or think you are pregnant  Sexually transmitted diseases are not treated at the clinic.    Dental Care: Organization         Address  Phone  Notes  [facility] Department of [facility] [address] [place], [place] [phone] Accepts children up to age 67 who are enrolled in [place] or [place] Health Choice; pregnant women with a Medicaid card; and children who have applied for Medicaid or [place] Health Choice, but were declined, whose parents can pay a reduced fee at time of service.  [facility]  [address], [place] [phone] Accepts children up to age 78 who are enrolled in [place] or [place] Health Choice; pregnant women with a Medicaid card; and children who have applied for Medicaid or  Health Choice, but were declined, whose parents can pay a reduced fee at time of service.  [facility]  [address] [place], [place] [phone] Patients are seen by appointment only. Walk-ins are not accepted. [facility] will see patients 109 years of age and older. Monday - Tuesday (8am-5pm) Most Wednesdays (8:30-5pm) $30 per visit, cash only  [facility]  [address], [facility] [phone] Patients are seen by appointment only. Walk-ins are not  accepted. [facility] will see patients 97 years of age and older. One Wednesday Evening (Monthly: Volunteer Based).  $30 per visit, cash only  [employer]  [phone] for adults; Children under age 11, call Graduate Pediatric Dentistry at [phone]. Children aged 81-14, please call [phone] to request a pediatric application.  Dental services are provided in all areas of dental care including fillings, crowns and bridges, complete and partial dentures, implants, gum treatment, root canals, and extractions. Preventive care is also provided. Treatment is provided to both adults and children. Patients are selected via a lottery and  there is often a waiting list.   [facility] [address], [place]  [phone] [email or web address]   [facility] [address] [place], [place] [phone], Ext. [phone] Second and Fourth Thursday of each month, opens at 6:30 AM; Clinic ends at 9 AM.  Patients are seen on a first-come first-served basis, and a limited number are seen during each clinic.   [facility]  [address] [place], [place] [phone]   Eligibility Requirements You must have lived in [place], [place], or [place] counties for at least the last three months.   You cannot be eligible for state or federal sponsored [place], including [employer], [place], or [employer].   You generally cannot be eligible for healthcare insurance through your employer.    How to apply: Eligibility screenings are held every Tuesday and Wednesday afternoon from 1:00 pm until 4:00 pm. You do not need an appointment for the interview!  [facility] [address], [place], [place] [phone]   [facility] [place] [facility] Health Department  [phone]   [facility] Health Department  [phone]   [facility] Health Department  [phone]    Behavioral Health Resources in the  Community: Intensive Outpatient Programs Organization         Address  Phone  Notes  [facility] [address], [place], [place] [phone]   [facility] Outpatient [address] [place], [place] [phone]   [facility] [address], [place], [place]  [phone]   [facility] Mental Health [address]  [place], [place] [phone] or [phone]   Substance Abuse Resources Organization         Address  Phone  Notes  [facility]  [phone]   [facility]  [phone]   [facility] [place]  [phone]   Floydene Flock  [phone]   Residential & Outpatient Substance Abuse Program  [phone]   Psychological Services Organization         Address  Phone  Notes  [facility]  [phone]   [facility]  [phone]   [facility] Mental Health [address] [place] [phone] or [phone]    Mobile Crisis Teams Organization         Address  Phone  Notes  [facility], Mobile Crisis Care Unit  [phone]   [facility]  [address] [place], [place] [phone]   Doristine Locks [address] [place] [phone]    Self-Help/Support Groups Organization         Address  Phone             Notes  [facility] [place] - variety of support groups  [phone]- [phone] Call for more information  Narcotics Anonymous (NA), [facility] [address], [employer] [place]  2 meetings at this location   Statistician         Address  Phone  Notes  [facility] [address],    [place]  [phone]   [facility]  [address] [place] [address], [place], [place] [phone]   [facility] [address] [place], [place] [phone] Admissions: 8am-3pm M-F  [facility] [address]    [place], [place] [phone]   [facility] [address], [place], [place]  [phone]   [facility] [address]  [place], [place] [phone]   [facility] - Intensive Outpatient [address], [place], [place] [phone]   [facility] ([facility] [address]  [place], [place] [phone] or [phone]   [facility] ([facility]) [address] [place], [place] [phone] Accepts Medicaid  [facility] [address]  [place] [phone] Substance Abuse/Addiction Treatment   [facility] Behavioral Health Resources Organization         Address  Phone  Notes  [facility]  [phone]) [phone]   Angie FavaJulie Brannon, PhD [address] [place], [place]   [phone]) [phone] or [phone](336) [phone]   [facility]   [address] [place], [place] [phone]) [phone]   [facility] [address], [place], [place] [phone]) [phone] Insurance/Medicaid/sponsorship through [facility]  [facility] Families [address]                                    [place], [place] [phone]) [phone] Therapy/tele-psych/case  [facility] [address]   [place], [place] [phone]    Dr. Lolly MustacheArfeen  [phone]   [facility] [place]  [employer] [facility] 1) [address], [place] 2) [address], [place] 3)  [address], [place] [phone]) [phone]) [phone]  [phone]   [facility] Child Abuse Hotline [phone](336) [phone] or [phone](336) [phone] (After Hours)

## 2014-04-05 NOTE — ED Notes (Signed)
Pt c/o R leg pain x a couple of months. Pt sts she had "a few" incidents and her leg has been hurting ever since. Pt has been seen multiple times for the same symptoms. Last seen two weeks ago and sts "I didn't get any refills for my pain management," Pt sts the pain extends from her toes to her hip in the R leg. No obvious deformity or swelling noted. Pt A&Ox4. Pt poor historian.

## 2014-06-19 ENCOUNTER — Encounter (HOSPITAL_COMMUNITY): Payer: Self-pay | Admitting: Emergency Medicine

## 2014-06-19 ENCOUNTER — Emergency Department (HOSPITAL_COMMUNITY)
Admission: EM | Admit: 2014-06-19 | Discharge: 2014-06-19 | Discharge status: Against Medical Advice | Payer: Medicaid Other | Attending: Emergency Medicine | Admitting: Emergency Medicine

## 2014-06-19 DIAGNOSIS — G629 Polyneuropathy, unspecified: Secondary | ICD-10-CM | POA: Insufficient documentation

## 2014-06-19 DIAGNOSIS — F419 Anxiety disorder, unspecified: Secondary | ICD-10-CM | POA: Diagnosis not present

## 2014-06-19 DIAGNOSIS — Z791 Long term (current) use of non-steroidal anti-inflammatories (NSAID): Secondary | ICD-10-CM | POA: Insufficient documentation

## 2014-06-19 DIAGNOSIS — Z7952 Long term (current) use of systemic steroids: Secondary | ICD-10-CM | POA: Insufficient documentation

## 2014-06-19 DIAGNOSIS — J45909 Unspecified asthma, uncomplicated: Secondary | ICD-10-CM | POA: Insufficient documentation

## 2014-06-19 DIAGNOSIS — G40909 Epilepsy, unspecified, not intractable, without status epilepticus: Secondary | ICD-10-CM | POA: Insufficient documentation

## 2014-06-19 DIAGNOSIS — M1990 Unspecified osteoarthritis, unspecified site: Secondary | ICD-10-CM | POA: Diagnosis not present

## 2014-06-19 DIAGNOSIS — R55 Syncope and collapse: Secondary | ICD-10-CM | POA: Insufficient documentation

## 2014-06-19 DIAGNOSIS — G8929 Other chronic pain: Secondary | ICD-10-CM | POA: Diagnosis not present

## 2014-06-19 DIAGNOSIS — M797 Fibromyalgia: Secondary | ICD-10-CM | POA: Diagnosis not present

## 2014-06-19 DIAGNOSIS — Z79899 Other long term (current) drug therapy: Secondary | ICD-10-CM | POA: Insufficient documentation

## 2014-06-19 LAB — BASIC METABOLIC PANEL
ANION GAP: 9 (ref 5–15)
BUN: 14 mg/dL (ref 6–20)
CHLORIDE: 109 mmol/L (ref 101–111)
CO2: 24 mmol/L (ref 22–32)
CREATININE: 0.47 mg/dL (ref 0.44–1.00)
Calcium: 8.8 mg/dL — ABNORMAL LOW (ref 8.9–10.3)
GLUCOSE: 97 mg/dL (ref 65–99)
Potassium: 3.9 mmol/L (ref 3.5–5.1)
Sodium: 142 mmol/L (ref 135–145)

## 2014-06-19 LAB — CBC
HEMATOCRIT: 35.2 % — AB (ref 36.0–46.0)
HEMOGLOBIN: 11.6 g/dL — AB (ref 12.0–15.0)
MCH: 31.4 pg (ref 26.0–34.0)
MCHC: 33 g/dL (ref 30.0–36.0)
MCV: 95.4 fL (ref 78.0–100.0)
Platelets: 240 10*3/uL (ref 150–400)
RBC: 3.69 MIL/uL — ABNORMAL LOW (ref 3.87–5.11)
RDW: 13.2 % (ref 11.5–15.5)
WBC: 10.1 10*3/uL (ref 4.0–10.5)

## 2014-06-19 LAB — CBG MONITORING, ED: Glucose-Capillary: 84 mg/dL (ref 65–99)

## 2014-06-19 MED ORDER — SODIUM CHLORIDE 0.9 % IV BOLUS (SEPSIS): 1000.0000 mL | INTRAVENOUS | Status: DC

## 2014-06-19 MED ORDER — DIPHENHYDRAMINE HCL 50 MG/ML IJ SOLN: 25.0000 mg | Freq: Once | INTRAMUSCULAR | Status: DC

## 2014-06-19 MED ORDER — METOCLOPRAMIDE HCL 5 MG/ML IJ SOLN: 10.0000 mg | Freq: Once | INTRAMUSCULAR | Status: DC

## 2014-06-19 MED ORDER — DIPHENHYDRAMINE HCL 50 MG/ML IJ SOLN: 12.5000 mg | Freq: Once | INTRAMUSCULAR | Status: DC

## 2014-06-19 MED ORDER — FENTANYL CITRATE (PF) 100 MCG/2ML IJ SOLN
50.0000 ug | Freq: Once | INTRAMUSCULAR | Status: AC
  Administered 2014-06-19: 50 ug via NASAL
  Filled 2014-06-19: qty 2

## 2014-06-19 NOTE — ED Provider Notes (Signed)
CSN: [id number]     Arrival date & time 06/19/14  2051 History   First MD Initiated Contact with Patient 06/19/14 2241     Chief Complaint  Patient presents with  . Near Syncope     (Consider location/radiation/quality/duration/timing/severity/associated sxs/prior Treatment) Patient is a 24 y.o. female presenting with near-syncope. The history is provided by the patient.  Near Syncope This is a new problem. The current episode started 1 to 2 hours ago. Episode frequency: once. The problem has been resolved. Pertinent negatives include no chest pain, no abdominal pain, no headaches and no shortness of breath. Exacerbated by: standing. Nothing relieves the symptoms. She has tried nothing for the symptoms. The treatment provided significant relief.    Past Medical History  Diagnosis Date  . Asthma   . Anorexia   . Fibromyalgia   . Seizures   . Anxiety   . Depression   . Eating disorder   . Chronic pain   . Neuropathy   . Arthritis    Past Surgical History  Procedure Laterality Date  . Tympanostomy tube placement    . Shock treatments      x21,electric treatments   Family History  Problem Relation Age of Onset  . Alcohol abuse Father   . Stroke Paternal Grandfather   . Breast cancer Paternal Grandmother   . Stroke Paternal Uncle    History  Substance Use Topics  . Smoking status: Never Smoker   . Smokeless tobacco: Never Used  . Alcohol Use: No   OB History    No data available     Review of Systems  Constitutional: Negative for fever and fatigue.  HENT: Negative for congestion and drooling.   Eyes: Negative for pain.  Respiratory: Negative for cough and shortness of breath.   Cardiovascular: Positive for near-syncope. Negative for chest pain.  Gastrointestinal: Negative for nausea, vomiting, abdominal pain and diarrhea.  Genitourinary: Negative for dysuria and hematuria.  Musculoskeletal: Negative for back pain, gait problem and neck pain.  Skin: Negative for  color change.  Neurological: Negative for dizziness and headaches.  Hematological: Negative for adenopathy.  Psychiatric/Behavioral: Negative for behavioral problems.  All other systems reviewed and are negative.     Allergies  Rexulti  Home Medications   Prior to Admission medications   Medication Sig Start Date End Date Taking? Authorizing Provider  clonazePAM (KLONOPIN) 0.5 MG tablet Take 1 tablet by mouth 3 (three) times daily as needed for anxiety.  05/25/13   Historical Provider, MD  gabapentin (NEURONTIN) 600 MG tablet Take 600 mg by mouth 3 (three) times daily.  08/01/13   Historical Provider, MD  HYDROcodone-acetaminophen (NORCO/VICODIN) 5-325 MG per tablet Take 1 tablet by mouth every 6 (six) hours as needed for moderate pain. 04/05/14   Fayrene Helper, PA-C  ibuprofen (ADVIL,MOTRIN) 200 MG tablet Take 400 mg by mouth every 6 (six) hours as needed for mild pain.    Historical Provider, MD  meloxicam (MOBIC) 15 MG tablet Take 15 mg by mouth daily.    Historical Provider, MD  predniSONE (DELTASONE) 20 MG tablet 3 tabs po day one, then 2 tabs daily x 4 days 04/05/14   Fayrene Helper, PA-C  traZODone (DESYREL) 100 MG tablet Take 300 mg by mouth 3 (three) times daily.     Historical Provider, MD   BP 103/53 mmHg  Pulse 85  Temp(Src) 97.5 F (36.4 C) (Oral)  Resp 16  SpO2 97% Physical Exam  Constitutional: She is oriented to person, place,  and time. She appears well-developed and well-nourished.  HENT:  Head: Normocephalic.  Mouth/Throat: Oropharynx is clear and moist. No oropharyngeal exudate.  Eyes: Conjunctivae and EOM are normal. Pupils are equal, round, and reactive to light.  Neck: Normal range of motion. Neck supple.  Cardiovascular: Normal rate, regular rhythm, normal heart sounds and intact distal pulses.  Exam reveals no gallop and no friction rub.   No murmur heard. Pulmonary/Chest: Effort normal and breath sounds normal. No respiratory distress. She has no wheezes.   Abdominal: Soft. Bowel sounds are normal. There is no tenderness. There is no rebound and no guarding.  Musculoskeletal: Normal range of motion. She exhibits no edema or tenderness.  Neurological: She is alert and oriented to person, place, and time.  alert, oriented x3 speech: normal in context and clarity memory: intact grossly cranial nerves II-XII: intact motor strength: full proximally and distally no involuntary movements or tremors sensation: intact to light touch diffusely  cerebellar: finger-to-nose and heel-to-shin intact    Skin: Skin is warm and dry.  Psychiatric: She has a normal mood and affect. Her behavior is normal.  Nursing note and vitals reviewed.   ED Course  Procedures (including critical care time) Labs Review Labs Reviewed  CBC - Abnormal; Notable for the following:    RBC 3.69 (*)    Hemoglobin 11.6 (*)    HCT 35.2 (*)    All other components within normal limits  BASIC METABOLIC PANEL - Abnormal; Notable for the following:    Calcium 8.8 (*)    All other components within normal limits  PREGNANCY, URINE  CBG MONITORING, ED  POC URINE PREG, ED    Imaging Review No results found.   EKG Interpretation   Date/Time:  Monday Jun 19 2014 21:16:52 EDT Ventricular Rate:  86 PR Interval:  130 QRS Duration: 89 QT Interval:  361 QTC Calculation: 432 R Axis:   73 Text Interpretation:  Sinus rhythm RSR' in V1 or V2, probably normal  variant Otherwise no significant change Confirmed by Atticus Lemberger  MD, Carmelo Reidel  ([id number]) on 06/19/2014 10:44:02 PM      MDM   Final diagnoses:  Near syncope    11:24 PM 24 y.o. female with history of asthma, fibromyalgia, seizures, eating disorder who presents with a near syncopal episode which occurred prior to arrival. She states that she has felt faint for the last 2-3 days and had 2 episodes of vomiting in the last 2 days. She ate dinner at a restaurant this evening and began feeling lightheaded while walking out. Her  partner caught her before she hit the ground. She does not think she had full loss of consciousness. It sounds like near-syncope. On exam she is complaining of generalized weakness and tingling. She states she has had a will intermittent global headache over the last few weeks and is currently 7 out of 10. She denies any recent head injuries or fevers. Vital signs unremarkable here. She is requesting narcotic pain medicine for body aches. I offered headache cocktail and IV fluids but she declined and pulled her IV out and preferred to leave. I recommended screening urine pregnancy prior to discharge but she states that she has the mirena and will not provide a urine sample. She's been screened for her near syncopal event, do not think any emergency present. I interpreted/reviewed the labs and/or imaging which were non-contributory. She has refused further treatment and evaluation.  11:26 PM: Pt ambulated out of building w/out ataxia or abnormal gait.  I have discussed the diagnosis/risks/treatment options with the patient and family and believe the pt to be eligible for discharge home to follow-up with her pcp. We also discussed returning to the ED immediately if new or worsening sx occur. We discussed the sx which are most concerning (e.g., worsening HA, fever, recurrent syncope) that necessitate immediate return. Medications administered to the patient during their visit and any new prescriptions provided to the patient are listed below.  Medications given during this visit Medications  fentaNYL (SUBLIMAZE) injection 50 mcg (50 mcg Nasal Given 06/19/14 2248)    New Prescriptions   No medications on file       Purvis Sheffield, MD 06/19/14 2329

## 2014-06-19 NOTE — Discharge Instructions (Signed)
Near-Syncope Near-syncope (commonly known as near fainting) is sudden weakness, dizziness, or feeling like you might pass out. During an episode of near-syncope, you may also develop pale skin, have tunnel vision, or feel sick to your stomach (nauseous). Near-syncope may occur when getting up after sitting or while standing for a long time. It is caused by a sudden decrease in blood flow to the brain. This decrease can result from various causes or triggers, most of which are not serious. However, because near-syncope can sometimes be a sign of something serious, a medical evaluation is required. The specific cause is often not determined. HOME CARE INSTRUCTIONS  Monitor your condition for any changes. The following actions may help to alleviate any discomfort you are experiencing:  Have someone stay with you until you feel stable.  Lie down right away and prop your feet up if you start feeling like you might faint. Breathe deeply and steadily. Wait until all the symptoms have passed. Most of these episodes last only a few minutes. You may feel tired for several hours.   Drink enough fluids to keep your urine clear or pale yellow.   If you are taking blood pressure or heart medicine, get up slowly when seated or lying down. Take several minutes to sit and then stand. This can reduce dizziness.  Follow up with your health care provider as directed. SEEK IMMEDIATE MEDICAL CARE IF:   You have a severe headache.   You have unusual pain in the chest, abdomen, or back.   You are bleeding from the mouth or rectum, or you have black or tarry stool.   You have an irregular or very fast heartbeat.   You have repeated fainting or have seizure-like jerking during an episode.   You faint when sitting or lying down.   You have confusion.   You have difficulty walking.   You have severe weakness.   You have vision problems.  MAKE SURE YOU:   Understand these instructions.  Will  watch your condition.  Will get help right away if you are not doing well or get worse. Document Released: 01/06/2005 Document Revised: 01/11/2013 Document Reviewed: 06/11/2012 [employer] Patient Information 2015 [employer]. This information is not intended to replace advice given to you by your health care provider. Make sure you discuss any questions you have with your health care provider.  

## 2014-06-19 NOTE — ED Notes (Signed)
Pt walked out of the ED making comments that were inaudible to staff.  Pt was able to ambulate without the assistance of her friend.  Pt was able to ambulate and speak at the same time.

## 2014-06-19 NOTE — ED Notes (Signed)
Pt arrived to the ED with a complaint of near syncope.  Pt boyfriend states that she was walking to through the parking lot when she collapse.  According to the patient she does remember blacking out.  Pt states she has osteoporosis and  Fibromyalgia.  Pt also states that she believes her IUD is causing her a reaction of pain.

## 2014-09-08 ENCOUNTER — Encounter (HOSPITAL_BASED_OUTPATIENT_CLINIC_OR_DEPARTMENT_OTHER): Payer: Self-pay

## 2014-09-08 ENCOUNTER — Emergency Department (HOSPITAL_BASED_OUTPATIENT_CLINIC_OR_DEPARTMENT_OTHER)
Admission: EM | Admit: 2014-09-08 | Discharge: 2014-09-08 | Disposition: A | Discharge status: Home / Self Care | Payer: Medicaid Other | Attending: Emergency Medicine | Admitting: Emergency Medicine

## 2014-09-08 DIAGNOSIS — G8929 Other chronic pain: Secondary | ICD-10-CM | POA: Insufficient documentation

## 2014-09-08 DIAGNOSIS — R51 Headache: Secondary | ICD-10-CM | POA: Diagnosis not present

## 2014-09-08 DIAGNOSIS — R112 Nausea with vomiting, unspecified: Secondary | ICD-10-CM | POA: Diagnosis not present

## 2014-09-08 DIAGNOSIS — J45909 Unspecified asthma, uncomplicated: Secondary | ICD-10-CM | POA: Insufficient documentation

## 2014-09-08 DIAGNOSIS — R102 Pelvic and perineal pain: Secondary | ICD-10-CM | POA: Insufficient documentation

## 2014-09-08 DIAGNOSIS — F329 Major depressive disorder, single episode, unspecified: Secondary | ICD-10-CM | POA: Diagnosis not present

## 2014-09-08 DIAGNOSIS — R079 Chest pain, unspecified: Secondary | ICD-10-CM | POA: Insufficient documentation

## 2014-09-08 DIAGNOSIS — M542 Cervicalgia: Secondary | ICD-10-CM | POA: Insufficient documentation

## 2014-09-08 DIAGNOSIS — F419 Anxiety disorder, unspecified: Secondary | ICD-10-CM | POA: Diagnosis not present

## 2014-09-08 DIAGNOSIS — R52 Pain, unspecified: Secondary | ICD-10-CM | POA: Diagnosis present

## 2014-09-08 DIAGNOSIS — M2550 Pain in unspecified joint: Secondary | ICD-10-CM | POA: Insufficient documentation

## 2014-09-08 DIAGNOSIS — M1990 Unspecified osteoarthritis, unspecified site: Secondary | ICD-10-CM | POA: Diagnosis not present

## 2014-09-08 DIAGNOSIS — G40909 Epilepsy, unspecified, not intractable, without status epilepticus: Secondary | ICD-10-CM | POA: Insufficient documentation

## 2014-09-08 DIAGNOSIS — G629 Polyneuropathy, unspecified: Secondary | ICD-10-CM | POA: Diagnosis not present

## 2014-09-08 DIAGNOSIS — M549 Dorsalgia, unspecified: Secondary | ICD-10-CM | POA: Diagnosis not present

## 2014-09-08 DIAGNOSIS — M797 Fibromyalgia: Secondary | ICD-10-CM | POA: Insufficient documentation

## 2014-09-08 DIAGNOSIS — Z79899 Other long term (current) drug therapy: Secondary | ICD-10-CM | POA: Insufficient documentation

## 2014-09-08 DIAGNOSIS — R531 Weakness: Secondary | ICD-10-CM | POA: Diagnosis not present

## 2014-09-08 HISTORY — DX: Non-celiac gluten sensitivity: K90.41

## 2014-09-08 NOTE — ED Notes (Signed)
C/o "pain all over for months"-pt in tears in triage-states her PCP "is doing nothing and told me to go the ER today"

## 2014-09-08 NOTE — ED Notes (Signed)
Immediately after EDP exited the pt's room, pt also exited the room and left the ER. Pt did not wait for d/c papers or prescriptions.

## 2014-09-08 NOTE — ED Provider Notes (Signed)
CSN: [id number]     Arrival date & time 09/08/14  1226 History   First MD Initiated Contact with Patient 09/08/14 1236     Chief Complaint  Patient presents with  . Generalized Body Aches      HPI  Patient presents for evaluation of a multitude of complaints.  She states "I hurt all over from head to toe". She states her doctor "will do nothing and we'll give me any more pain medicine". States she has been referred to pain management "that doesn't do shit".  He feels "really sick" is frequently dizzy as recurrent abdominal pain nausea and vomiting. States she is easily upset. He denies being suicidal or homicidal. History of eating disorder. Denies purging. States she is eating normally and her weight is stable.  She states that "if you can't help manage this, I just need something to get through today I just need something for pain".  Past Medical History  Diagnosis Date  . Asthma   . Anorexia   . Fibromyalgia   . Seizures   . Anxiety   . Depression   . Eating disorder   . Chronic pain   . Neuropathy   . Arthritis   . Gluten intolerance    Past Surgical History  Procedure Laterality Date  . Tympanostomy tube placement    . Shock treatments      x21,electric treatments   Family History  Problem Relation Age of Onset  . Alcohol abuse Father   . Stroke Paternal Grandfather   . Breast cancer Paternal Grandmother   . Stroke Paternal Uncle    Social History  Substance Use Topics  . Smoking status: Never Smoker   . Smokeless tobacco: Never Used  . Alcohol Use: No   OB History    No data available     Review of Systems  Constitutional: Positive for fatigue. Negative for fever, chills, diaphoresis and appetite change.  HENT: Negative for mouth sores, sore throat and trouble swallowing.   Eyes: Negative for visual disturbance.  Respiratory: Negative for cough, chest tightness, shortness of breath and wheezing.   Cardiovascular: Positive for chest pain.   Gastrointestinal: Positive for abdominal pain. Negative for nausea, vomiting, diarrhea and abdominal distention.  Endocrine: Negative for polydipsia, polyphagia and polyuria.  Genitourinary: Positive for pelvic pain. Negative for dysuria, frequency and hematuria.  Musculoskeletal: Positive for myalgias, back pain, arthralgias and neck pain. Negative for gait problem.  Skin: Negative for color change, pallor and rash.  Neurological: Positive for dizziness, weakness and headaches. Negative for syncope and light-headedness.  Hematological: Does not bruise/bleed easily.  Psychiatric/Behavioral: Negative for suicidal ideas, behavioral problems, confusion, sleep disturbance and self-injury.      Allergies  Rexulti  Home Medications   Prior to Admission medications   Medication Sig Start Date End Date Taking? Authorizing Provider  clonazePAM (KLONOPIN) 0.5 MG tablet Take 1 tablet by mouth 3 (three) times daily as needed for anxiety.  05/25/13   Historical Provider, MD  gabapentin (NEURONTIN) 600 MG tablet Take 600 mg by mouth 3 (three) times daily.  08/01/13   Historical Provider, MD  ibuprofen (ADVIL,MOTRIN) 200 MG tablet Take 400 mg by mouth every 6 (six) hours as needed for mild pain.    Historical Provider, MD  traZODone (DESYREL) 100 MG tablet Take 300 mg by mouth 3 (three) times daily.     Historical Provider, MD   BP 120/91 mmHg  Pulse 82  Temp(Src) 98.2 F (36.8 C) (Oral)  Resp  20  Ht 5\' 5"  (1.651 m)  Wt 122 lb (55.339 kg)  BMI 20.30 kg/m2  SpO2 96% Physical Exam  Constitutional: She is oriented to person, place, and time. She appears well-developed and well-nourished. No distress.  Awake alert. Emotionally labile. Oriented 3. Appears in no frank distress.  HENT:  Head: Normocephalic.  Eyes: Conjunctivae are normal. Pupils are equal, round, and reactive to light. No scleral icterus.  Neck: Normal range of motion. Neck supple. No thyromegaly present.  Cardiovascular: Normal  rate and regular rhythm.  Exam reveals no gallop and no friction rub.   No murmur heard. Pulmonary/Chest: Effort normal and breath sounds normal. No respiratory distress. She has no wheezes. She has no rales.  Abdominal: Soft. Bowel sounds are normal. She exhibits no distension. There is no tenderness. There is no rebound.  Musculoskeletal: Normal range of motion.  Neurological: She is alert and oriented to person, place, and time.  Skin: Skin is warm and dry. No rash noted.  Psychiatric: She has a normal mood and affect. Her behavior is normal.    ED Course  Procedures (including critical care time) Labs Review Labs Reviewed - No data to display  Imaging Review No results found. I have personally reviewed and evaluated these images and lab results as part of my medical decision-making.   EKG Interpretation None      MDM   Final diagnoses:  Pain    Had a discussion with her. I offered her evaluation here for possible dehydration. Lab evaluation. I doubt this is electrolyte abnormality to explain her weakness her head to toe pain. I think this is very likely some anxiety and chronic pain. I told her no uncertain terms that will not function as pain management for her that this should come through primary care or pain management. She was not accepting of this. She refused lab evaluation or any further evaluation here and left before she can be given any sort of further discussion or discharge paperwork. Stating as she left "you're just in old angry shitty doctor".    Rolland Porter, MD 09/08/14 1714

## 2016-01-05 IMAGING — CT CT MAXILLOFACIAL W/O CM
4 of 9 series · 13 of 47 positions shown, 15 images · non-contrast
Comparison: Head CTs without contrast 06/14/2013 and earlier.

CLINICAL DATA: 23-year-old female who fell at 5665 hr, decreased
mental status, neck pain, right head hematoma. Initial encounter.

EXAM:
CT HEAD WITHOUT CONTRAST
CT MAXILLOFACIAL WITHOUT CONTRAST
CT CERVICAL SPINE WITHOUT CONTRAST
TECHNIQUE: Multidetector CT imaging of the head, cervical spine, and
maxillofacial structures were performed using the standard protocol
without intravenous contrast. Multiplanar CT image reconstructions
of the cervical spine and maxillofacial structures were also
generated.

[Series 13: c_spine 2.0 b30s · axial · 0.26mm/px · z∈[-320,-200]mm · 5 of 91 slices shown]
[im 16/91  bone]
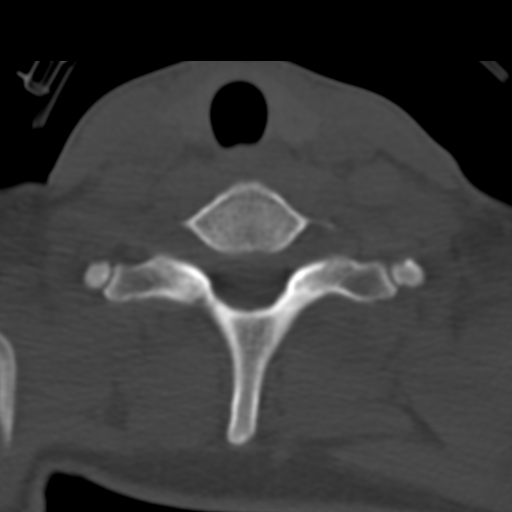
[im 31/91  bone]
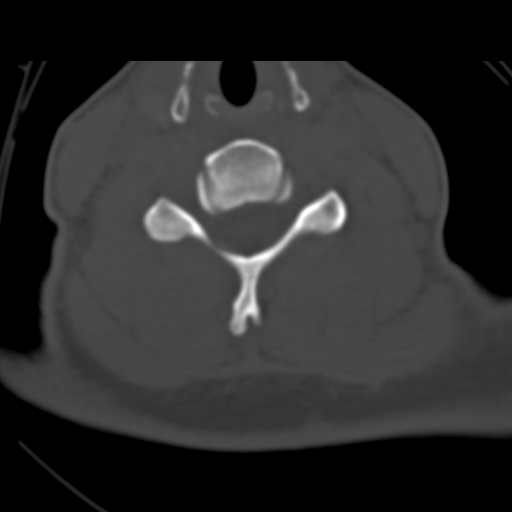
[im 46/91  bone]
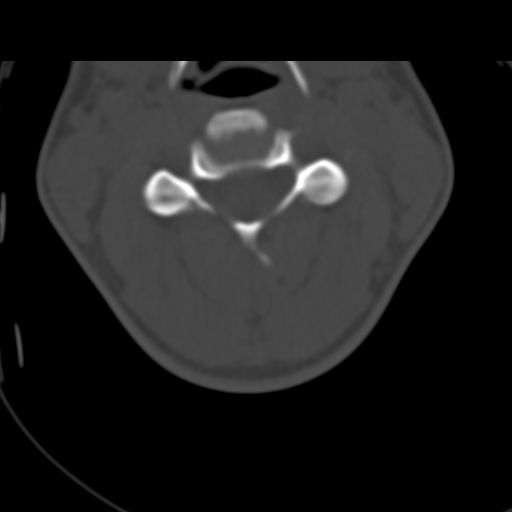
[im 61/91  bone]
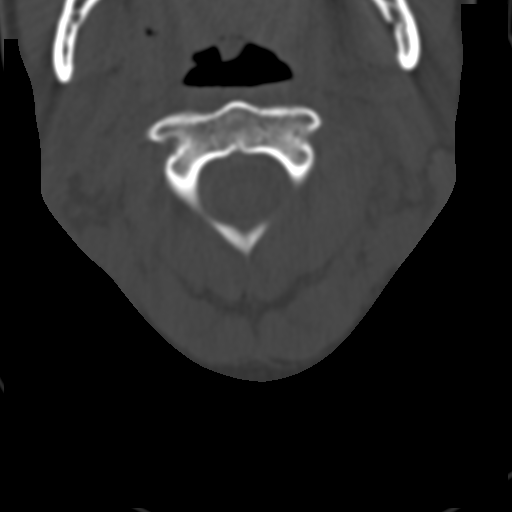
[im 76/91  bone]
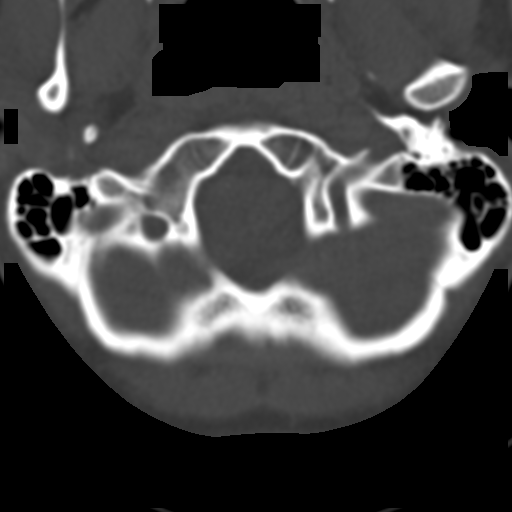

[Series 14: coronal bone · coronal · 0.24mm/px · 1 of 40 slices shown]
[im 20/40  bone]
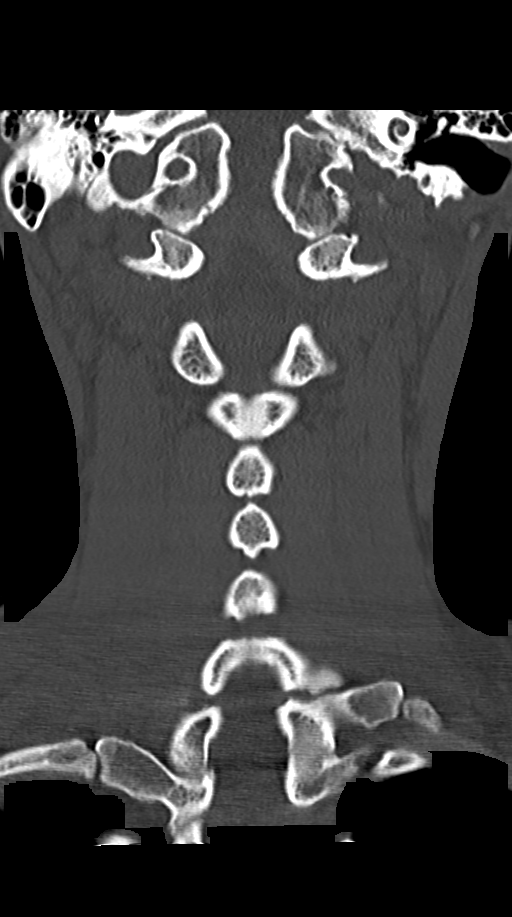

[Series 15: sagittal bone · sagittal · 0.27mm/px · 1 of 48 slices shown]
[im 24/48  bone]
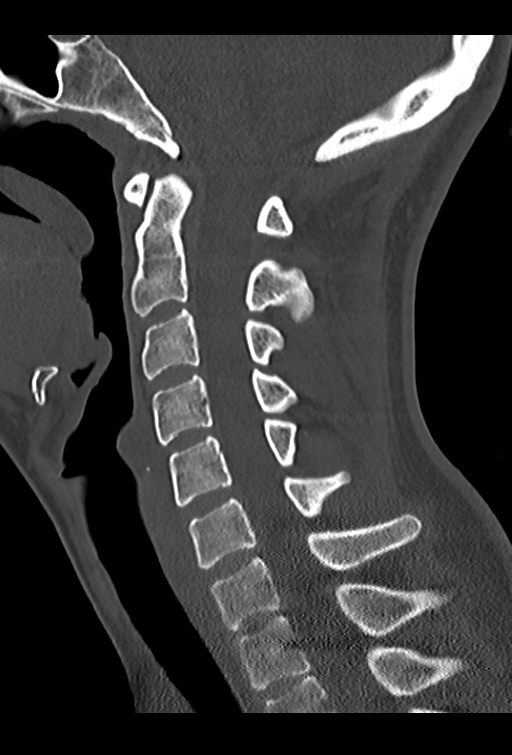

[Series 16: orthogonal axials · axial · 0.17mm/px · z∈[-351,-218]mm · 6 of 99 slices shown, 8 images]
[im 15/99  brain]
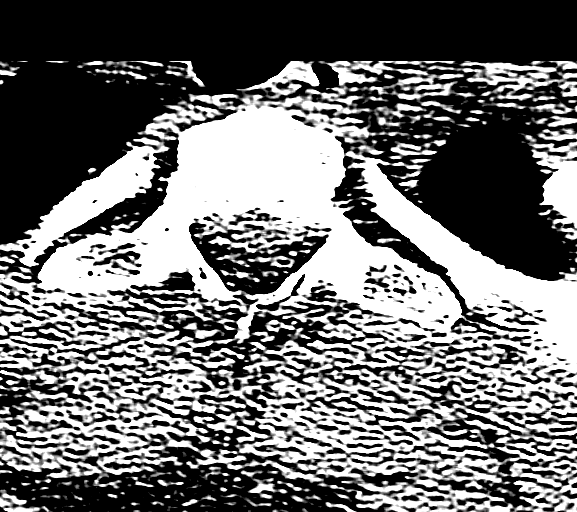
[im 15/99  bone]
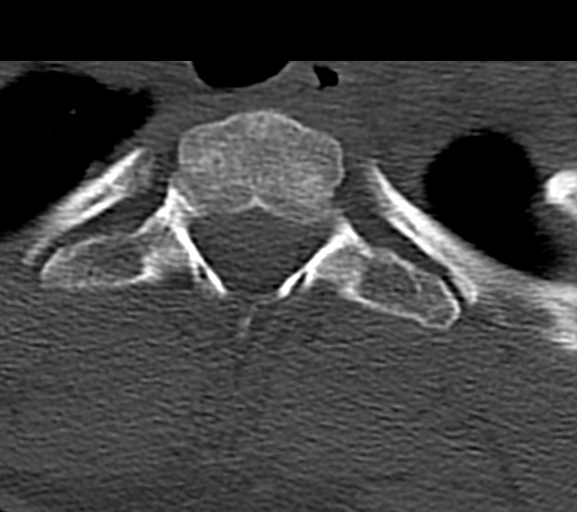
[im 29/99  bone]
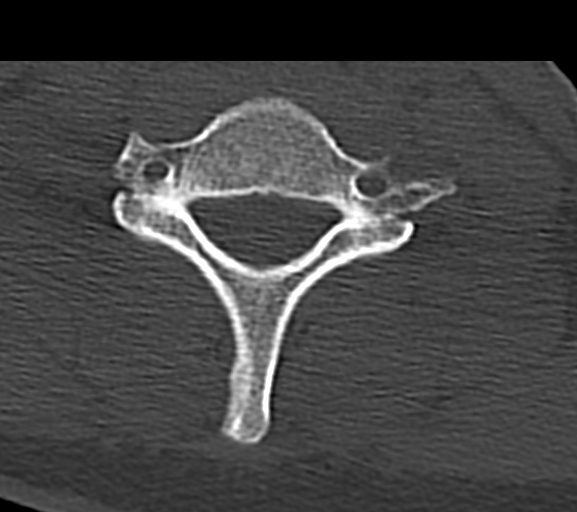
[im 43/99  bone]
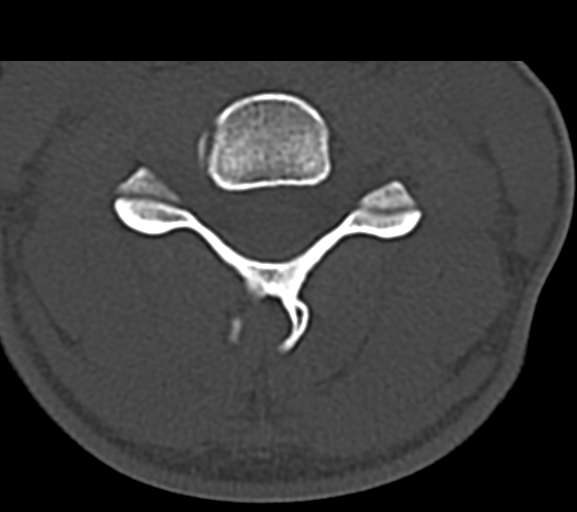
[im 57/99  bone]
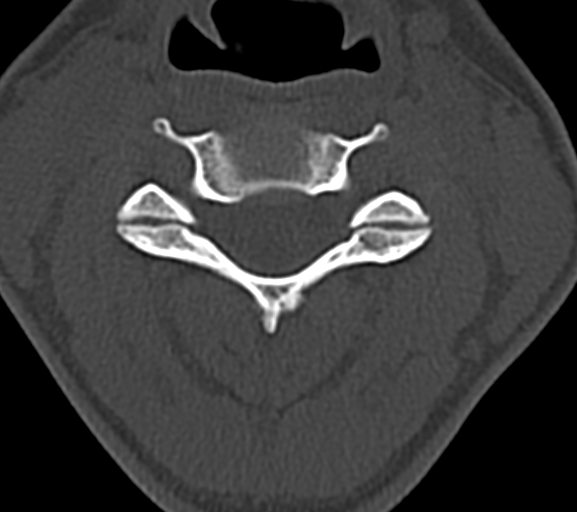
[im 71/99  brain]
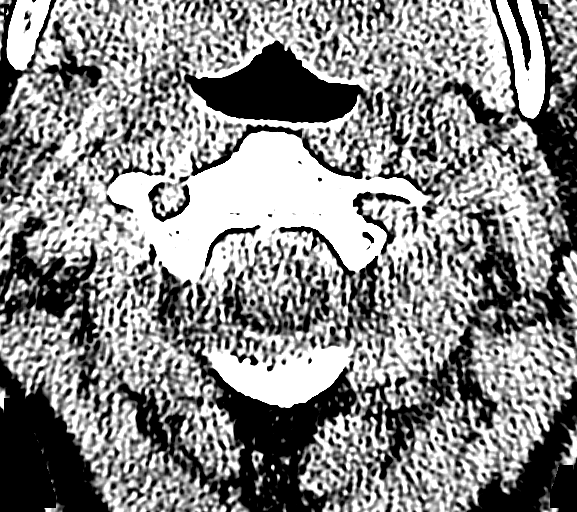
[im 71/99  bone]
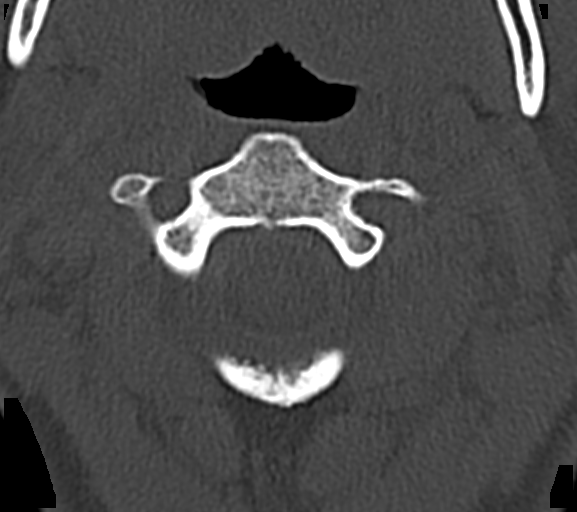
[im 85/99  bone]
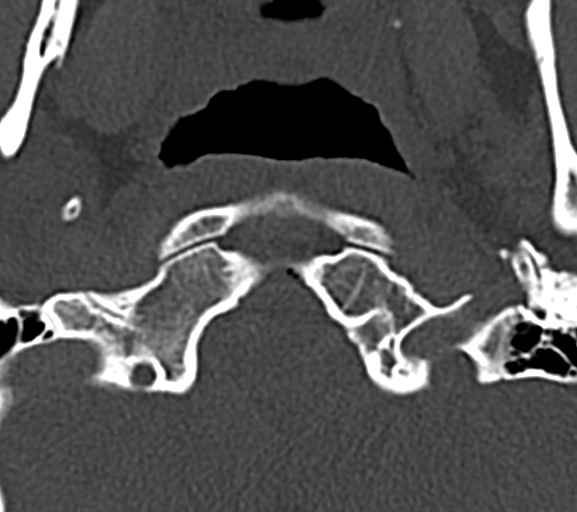

[13 of 47 positions shown; findings below may reference images not displayed]

FINDINGS: CT HEAD FINDINGS

No superior or posterior scalp hematoma identified. See facial
findings below. Mastoids and tympanic cavities are clear. Calvarium
intact.

Stable and normal cerebral volume. Incidental mega cisterna magna
anatomic variant. No midline shift, ventriculomegaly, mass effect,
evidence of mass lesion, intracranial hemorrhage or evidence of
cortically based acute infarction. Gray-white matter differentiation
is within normal limits throughout the brain. No suspicious
intracranial vascular hyperdensity.

CT MAXILLOFACIAL FINDINGS

Right periorbital soft tissue hematoma measures up to 9 mm in
thickness. Associated right premalar are soft tissue contusion.
Underlying right globe is intact. No intraorbital soft tissue injury
identified. Right orbital walls are intact.

Right zygoma intact. Paranasal sinuses are clear. Nasal bones
intact. Mandible intact. No acute facial fracture identified.

Normal left orbit. Visible non contrast deep soft tissue spaces of
the face are within normal limits.

CT CERVICAL SPINE FINDINGS

Relatively preserved cervical lordosis. Visualized skull base is
intact. No atlanto-occipital dissociation. Cervicothoracic junction
alignment is within normal limits. Bilateral posterior element
alignment is within normal limits. No acute cervical spine fracture
identified. Negative lung apices. Visible upper thoracic levels
appear intact. Negative non contrast paraspinal soft tissues.
IMPRESSION: 1. Right periorbital soft tissue injury without acute facial
fracture. No intraorbital injury.
2.  Normal noncontrast CT appearance of the brain.
3. No acute fracture or listhesis identified in the cervical spine.
Ligamentous injury is not excluded.

## 2016-01-09 ENCOUNTER — Encounter (HOSPITAL_COMMUNITY): Payer: Self-pay

## 2016-01-09 ENCOUNTER — Emergency Department (HOSPITAL_COMMUNITY)
Admission: EM | Admit: 2016-01-09 | Discharge: 2016-01-09 | Disposition: A | Discharge status: Home / Self Care | Payer: Medicaid Other | Attending: Emergency Medicine | Admitting: Emergency Medicine

## 2016-01-09 ENCOUNTER — Emergency Department (HOSPITAL_COMMUNITY): Payer: Medicaid Other

## 2016-01-09 DIAGNOSIS — R079 Chest pain, unspecified: Secondary | ICD-10-CM | POA: Diagnosis present

## 2016-01-09 DIAGNOSIS — E876 Hypokalemia: Secondary | ICD-10-CM | POA: Diagnosis not present

## 2016-01-09 DIAGNOSIS — M5442 Lumbago with sciatica, left side: Secondary | ICD-10-CM | POA: Insufficient documentation

## 2016-01-09 DIAGNOSIS — M5440 Lumbago with sciatica, unspecified side: Secondary | ICD-10-CM

## 2016-01-09 DIAGNOSIS — M5441 Lumbago with sciatica, right side: Secondary | ICD-10-CM | POA: Insufficient documentation

## 2016-01-09 DIAGNOSIS — J45909 Unspecified asthma, uncomplicated: Secondary | ICD-10-CM | POA: Insufficient documentation

## 2016-01-09 LAB — COMPREHENSIVE METABOLIC PANEL
ALBUMIN: 4.5 g/dL (ref 3.5–5.0)
ALK PHOS: 39 U/L (ref 38–126)
ALT: 18 U/L (ref 14–54)
AST: 20 U/L (ref 15–41)
Anion gap: 8 (ref 5–15)
BUN: 13 mg/dL (ref 6–20)
CHLORIDE: 104 mmol/L (ref 101–111)
CO2: 27 mmol/L (ref 22–32)
Calcium: 9.3 mg/dL (ref 8.9–10.3)
Creatinine, Ser: 0.54 mg/dL (ref 0.44–1.00)
GFR calc non Af Amer: >60 mL/min (ref >60)
GLUCOSE: 89 mg/dL (ref 65–99)
Potassium: 3.2 mmol/L — ABNORMAL LOW (ref 3.5–5.1)
SODIUM: 139 mmol/L (ref 135–145)
Total Bilirubin: 0.9 mg/dL (ref 0.3–1.2)
Total Protein: 7.5 g/dL (ref 6.5–8.1)

## 2016-01-09 LAB — CBC WITH DIFFERENTIAL/PLATELET
Basophils Absolute: 0 10*3/uL (ref 0.0–0.1)
Basophils Relative: 0 %
Eosinophils Absolute: 0.2 10*3/uL (ref 0.0–0.7)
Eosinophils Relative: 3 %
HCT: 41.8 % (ref 36.0–46.0)
HEMOGLOBIN: 14.9 g/dL (ref 12.0–15.0)
LYMPHS ABS: 3.1 10*3/uL (ref 0.7–4.0)
Lymphocytes Relative: 47 %
MCH: 32.3 pg (ref 26.0–34.0)
MCHC: 35.6 g/dL (ref 30.0–36.0)
MCV: 90.5 fL (ref 78.0–100.0)
Monocytes Absolute: 0.4 10*3/uL (ref 0.1–1.0)
Monocytes Relative: 6 %
NEUTROS PCT: 44 %
Neutro Abs: 3 10*3/uL (ref 1.7–7.7)
Platelets: 256 10*3/uL (ref 150–400)
RBC: 4.62 MIL/uL (ref 3.87–5.11)
RDW: 12.4 % (ref 11.5–15.5)
WBC: 6.8 10*3/uL (ref 4.0–10.5)

## 2016-01-09 LAB — I-STAT BETA HCG BLOOD, ED (MC, WL, AP ONLY): I-stat hCG, quantitative: <5 m[IU]/mL (ref <5)

## 2016-01-09 LAB — URINALYSIS, ROUTINE W REFLEX MICROSCOPIC
Bilirubin Urine: NEGATIVE
GLUCOSE, UA: NEGATIVE mg/dL
Hgb urine dipstick: NEGATIVE
Ketones, ur: NEGATIVE mg/dL
LEUKOCYTES UA: NEGATIVE
NITRITE: NEGATIVE
PH: 7 (ref 5.0–8.0)
Protein, ur: NEGATIVE mg/dL
SPECIFIC GRAVITY, URINE: 1.008 (ref 1.005–1.030)

## 2016-01-09 LAB — LIPASE, BLOOD: Lipase: 25 U/L (ref 11–51)

## 2016-01-09 MED ORDER — POTASSIUM CHLORIDE CRYS ER 20 MEQ PO TBCR
40.0000 meq | EXTENDED_RELEASE_TABLET | Freq: Once | ORAL | Status: AC
  Administered 2016-01-09: 40 meq via ORAL
  Filled 2016-01-09: qty 2

## 2016-01-09 MED ORDER — FENTANYL CITRATE (PF) 100 MCG/2ML IJ SOLN
100.0000 ug | Freq: Once | INTRAMUSCULAR | Status: AC
  Administered 2016-01-09: 100 ug via INTRAVENOUS
  Filled 2016-01-09: qty 2

## 2016-01-09 NOTE — ED Provider Notes (Signed)
[facility]-EMERGENCY DEPT Provider Note   CSN: [id number] Arrival date & time: 01/09/16  0417     History   Chief Complaint Chief Complaint  Patient presents with  . Back Pain    HPI Terry Schneider is a 25 y.o. female.  The history is provided by the patient.  Back Pain   This is a new problem. The current episode started more than 2 days ago. The problem occurs constantly. The problem has been gradually worsening. The pain is associated with no known injury. The pain is present in the gluteal region. The quality of the pain is described as stabbing. The pain radiates to the right thigh and left thigh. The pain is severe. The symptoms are aggravated by certain positions. Associated symptoms include weakness. Pertinent negatives include no fever and no dysuria. Risk factors: scoliosis.  Patient with h/o anxiety, chronic pain, Fibromyalgia presents with multiple complaints:  1. Back pain for a week, low back/gluteal region and radiates into legs.  No urinary/fecal incontinence.  She reports both legs feels weak.  No trauma reported No new abd pain 2. She reports back pain is so severe she has nausea/vomiting.   3. She reports back pain is so severe it causes CP/SOB.  She reports it will last for an hour and resolve.   Denies h/o any cardiac conditions and no h/o PE No h/o back surgery  Past Medical History:  Diagnosis Date  . Anorexia   . Anxiety   . Arthritis   . Asthma   . Chronic pain   . Depression   . Eating disorder   . Fibromyalgia   . Gluten intolerance   . Neuropathy (HCC)   . Seizures [facility](HCC)     Patient Active Problem List   Diagnosis Date Noted  . 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 Eating disorder 06/02/2013  . Self-mutilation 06/02/2013  . Anxiety and depression 06/02/2013  . Hallucinations 06/02/2013  . Seizures (HCC) 06/02/2013  . Fibromyalgia 06/02/2013    Past Surgical History:  Procedure Laterality Date  . shock treatments      x21,electric treatments  . TYMPANOSTOMY TUBE PLACEMENT      OB History    No data available       Home Medications    Prior to Admission medications   Medication Sig Start Date End Date Taking? Authorizing Provider  clonazePAM (KLONOPIN) 0.5 MG tablet Take 1 tablet by mouth 3 (three) times daily as needed for anxiety.  05/25/13   Historical Provider, MD  gabapentin (NEURONTIN) 600 MG tablet Take 600 mg by mouth 3 (three) times daily.  08/01/13   Historical Provider, MD  ibuprofen (ADVIL,MOTRIN) 200 MG tablet Take 400 mg by mouth every 6 (six) hours as needed for mild pain.    Historical Provider, MD  traZODone (DESYREL) 100 MG tablet Take 300 mg by mouth 3 (three) times daily.     Historical Provider, MD    Family History Family History  Problem Relation Age of Onset  . Alcohol abuse Father   . Stroke Paternal Grandfather   . Breast cancer Paternal Grandmother   . Stroke Paternal Uncle     Social History Social History  Substance Use Topics  . Smoking status: Never Smoker  . Smokeless tobacco: Never Used  . Alcohol use No     Allergies   Rexulti [brexpiprazole]   Review of Systems Review of Systems  Constitutional: Negative for fever.  Gastrointestinal: Positive for vomiting.  Genitourinary: Negative for dysuria.  Musculoskeletal: Positive for back pain.  Neurological: Positive for weakness.  All other systems reviewed and are negative.    Physical Exam Updated Vital Signs BP 125/88 (BP Location: Left Arm)   Pulse 98   Temp 97.5 F (36.4 C) (Oral)   Resp 20   SpO2 99%   Physical Exam  CONSTITUTIONAL: Thin, anxioous, whispers throughout exam HEAD: Normocephalic/atraumatic EYES: EOMI/PERRL ENMT: Mucous membranes moist NECK: supple no meningeal signs SPINE/BACK:entire spine nontender, tenderness noted to gluteal region No bruising/crepitance/stepoffs noted to spine CV: S1/S2 noted, no murmurs/rubs/gallops noted LUNGS: Lungs are clear to  auscultation bilaterally, no apparent distress ABDOMEN: soft, nontender, no rebound or guarding GU:no cva tenderness NEURO: Awake/alert,equal motor 5/5 strength noted with the following: hip flexion/knee flexion/extension, foot dorsi/plantar flexion, no clonus bilaterally,no sensory deficit in any dermatome.  Equal patellar/achilles reflex noted in bilateral lower extremities.  Pt is able to ambulate unassisted. EXTREMITIES: pulses normal, full ROM SKIN: warm, color normal PSYCH: anxious  ED Treatments / Results  Labs (all labs ordered are listed, but only abnormal results are displayed) Labs Reviewed  COMPREHENSIVE METABOLIC PANEL - Abnormal; Notable for the following:       Result Value   Potassium 3.2 (*)    All other components within normal limits  URINALYSIS, ROUTINE W REFLEX MICROSCOPIC - Abnormal; Notable for the following:    Color, Urine STRAW (*)    All other components within normal limits  CBC WITH DIFFERENTIAL/PLATELET  LIPASE, BLOOD  I-STAT BETA HCG BLOOD, ED ([facility], [facility], [facility] ONLY)    ED ECG REPORT   Date: 01/09/2016 0507  Rate: 70  Rhythm: normal sinus rhythm  QRS Axis: normal  Intervals: normal  ST/T Wave abnormalities: early repolarization  Conduction Disutrbances:none  I have personally reviewed the EKG tracing and agree with the computerized printout as noted.  Radiology Dg Chest 2 View  Result Date: 01/09/2016 CLINICAL DATA:  25 year old female with worsening chest pain and mid back pain. Shortness of breath. EXAM: CHEST  2 VIEW COMPARISON:  Chest radiograph dated 03/06/2013 FINDINGS: The heart size and mediastinal contours are within normal limits. Both lungs are clear. The visualized skeletal structures are unremarkable. IMPRESSION: No active cardiopulmonary disease. Electronically Signed   By: Elgie CollardArash  Radparvar M.D.   On: 01/09/2016 05:40    Procedures Procedures   Medications Ordered in ED Medications  fentaNYL (SUBLIMAZE) injection 100 mcg (100 mcg  Intravenous Given 01/09/16 0533)     Initial Impression / Assessment and Plan / ED Course  I have reviewed the triage vital signs and the nursing notes.  Pertinent labs & imaging results that were available during my care of the patient were reviewed by me and considered in my medical decision making (see chart for details).  Clinical Course    Pt appears improved She is resting comfortably No neuro deficits She is ambulatory Her main issue tonight was low back pain She had some radiation of pain into legs but no focal weakness I feel she is safe for d/c home and followup as outpatient Pt is agreeable We discussed return precautions  As for CP, this was secondary complaint, vitals appropriate, EKG/CXR unremarkable, doubt ACS/PE   Final Clinical Impressions(s) / ED Diagnoses   Final diagnoses:  Acute low back pain with sciatica, sciatica laterality unspecified, unspecified back pain laterality  Chest pain, unspecified type  Hypokalemia    New Prescriptions New Prescriptions   No medications on file     Zadie Rhineonald Jamesyn [place]field, MD 01/09/16 [phone]

## 2016-01-09 NOTE — Discharge Instructions (Signed)

## 2016-01-09 NOTE — ED Triage Notes (Signed)
Pt complains of back pain and it's causing her to be short of breath  She also states that she is itching all over

## 2016-05-22 ENCOUNTER — Emergency Department (HOSPITAL_COMMUNITY)
Admission: EM | Admit: 2016-05-22 | Discharge: 2016-05-23 | Disposition: A | Discharge status: Home / Self Care | Payer: Medicaid Other | Attending: Emergency Medicine | Admitting: Emergency Medicine

## 2016-05-22 ENCOUNTER — Encounter (HOSPITAL_COMMUNITY): Payer: Self-pay | Admitting: *Deleted

## 2016-05-22 DIAGNOSIS — J45909 Unspecified asthma, uncomplicated: Secondary | ICD-10-CM | POA: Insufficient documentation

## 2016-05-22 DIAGNOSIS — G8929 Other chronic pain: Secondary | ICD-10-CM | POA: Insufficient documentation

## 2016-05-22 DIAGNOSIS — M545 Low back pain, unspecified: Secondary | ICD-10-CM

## 2016-05-22 LAB — POC URINE PREG, ED: Preg Test, Ur: NEGATIVE

## 2016-05-22 NOTE — ED Triage Notes (Signed)
Pt has lower back radiating down to both feet. Pt has appt w/ neurologist 5/14 but states she cannot wait. Pt states she had incontinence of bowel and bladder last week. Pt believes she has scoliosis but has never been diagnosed.

## 2016-05-23 ENCOUNTER — Encounter (HOSPITAL_COMMUNITY): Payer: Self-pay | Admitting: Emergency Medicine

## 2016-05-23 MED ORDER — METHOCARBAMOL 500 MG PO TABS: 500.0000 mg | ORAL_TABLET | Freq: Two times a day (BID) | ORAL | 0 refills | Status: DC

## 2016-05-23 MED ORDER — LIDOCAINE 5 % EX PTCH: 1.0000 | MEDICATED_PATCH | CUTANEOUS | 0 refills | Status: DC

## 2016-05-23 MED ORDER — KETOROLAC TROMETHAMINE 60 MG/2ML IM SOLN
60.0000 mg | Freq: Once | INTRAMUSCULAR | Status: AC
  Administered 2016-05-23: 60 mg via INTRAMUSCULAR
  Filled 2016-05-23: qty 2

## 2016-05-23 NOTE — ED Provider Notes (Signed)
[facility]-EMERGENCY DEPT Provider Note   CSN: [id number] Arrival date & time: 05/22/16  2204   By signing my name below, I, Clarisse Gouge, attest that this documentation has been prepared under the direction and in the presence of Othon Guardia, MD. Electronically signed, Clarisse Gouge, ED Scribe. 05/23/16. 12:44 AM.  History   Chief Complaint Chief Complaint  Patient presents with  . Back Pain   The history is provided by the patient and medical records. No language interpreter was used.  Back Pain   This is a chronic problem. The current episode started more than 1 week ago. The problem occurs constantly. The problem has been gradually worsening. The pain is associated with no known injury. The pain is present in the lumbar spine. The quality of the pain is described as stabbing and shooting. The pain is at a severity of 8/10. The pain is severe. The symptoms are aggravated by certain positions. The pain is the same all the time. Stiffness is present all day. Pertinent negatives include no chest pain, no fever, no numbness, no weight loss, no headaches, no abdominal pain, no abdominal swelling, no bowel incontinence, no perianal numbness, no bladder incontinence, no dysuria, no pelvic pain, no leg pain, no paresthesias, no paresis, no tingling and no weakness. The treatment provided no relief. Risk factors include poor posture.    Terry Schneider is a 26 y.o. female with h/o fibromyalgia and chronic pain syndrome and neuropathy, who presents to the Emergency Department with concern for gradually worsened chronic back pain. She describes  8/10 sharp pain.. She reports h/o undiagnosed scoliosis and chronic back pain. Pt states she has tried mobic without relief. Pt seen at [facility] health in when her neurology referral was made 03/2016. She states she has no other pain medications at home and she has not yet started f/u with pain management. Upcoming appointment noted with neurologist 06/02/2016; she  states her pain is too severe to wait for this appointment. No dysuria or hematuria. No other complaints at this time.    Past Medical History:  Diagnosis Date  . Anorexia   . Anxiety   . Arthritis   . Asthma   . Chronic pain   . Depression   . Eating disorder   . Fibromyalgia   . Gluten intolerance   . Neuropathy   . Seizures [facility])     Patient Active Problem List   Diagnosis Date Noted  . 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 Eating disorder 06/02/2013  . Self-mutilation 06/02/2013  . Anxiety and depression 06/02/2013  . Hallucinations 06/02/2013  . Seizures (HCC) 06/02/2013  . Fibromyalgia 06/02/2013    Past Surgical History:  Procedure Laterality Date  . shock treatments     x21,electric treatments  . TYMPANOSTOMY TUBE PLACEMENT      OB History    No data available       Home Medications    Prior to Admission medications   Medication Sig Start Date End Date Taking? Authorizing Provider  BIOTIN PO Take 1 tablet by mouth daily.   Yes Historical Provider, MD  cholecalciferol (VITAMIN D) 1000 units tablet Take 1,000 Units by mouth daily.   Yes Historical Provider, MD  ibuprofen (ADVIL,MOTRIN) 200 MG tablet Take 400 mg by mouth every 6 (six) hours as needed for mild pain.   Yes Historical Provider, MD  MELATONIN PO Take 1 tablet by mouth at bedtime as needed (sleep).   Yes Historical Provider, MD  meloxicam (MOBIC) 15 MG  tablet Take 1 tablet by mouth daily. 04/11/16  Yes Historical Provider, MD  Multiple Vitamin (MULTIVITAMIN WITH MINERALS) TABS tablet Take 1 tablet by mouth daily.   Yes Historical Provider, MD  traZODone (DESYREL) 150 MG tablet Take 150 mg by mouth at bedtime. 05/07/16  Yes Historical Provider, MD    Family History Family History  Problem Relation Age of Onset  . Alcohol abuse Father   . Stroke Paternal Grandfather   . Breast cancer Paternal Grandmother   . Stroke Paternal Uncle     Social History Social History    Substance Use Topics  . Smoking status: Never Smoker  . Smokeless tobacco: Never Used  . Alcohol use No     Allergies   Rexulti [brexpiprazole]   Review of Systems Review of Systems  Constitutional: Negative for fever and weight loss.  Cardiovascular: Negative for chest pain.  Gastrointestinal: Negative for abdominal pain and bowel incontinence.  Genitourinary: Negative for bladder incontinence, decreased urine volume, difficulty urinating, dysuria, hematuria, pelvic pain, vaginal bleeding and vaginal discharge.  Musculoskeletal: Positive for back pain. Negative for gait problem, joint swelling, myalgias, neck pain and neck stiffness.  Skin: Negative for color change and wound.  Neurological: Negative for tingling, weakness, numbness, headaches and paresthesias.  All other systems reviewed and are negative.    Physical Exam Updated Vital Signs BP 132/82 (BP Location: Right Arm)   Pulse 87   Temp 98.4 F (36.9 C)   Resp 18   SpO2 100%   Physical Exam  Constitutional: She is oriented to person, place, and time. She appears well-developed and well-nourished. No distress.  HENT:  Head: Normocephalic and atraumatic.  Mouth/Throat: Oropharynx is clear and moist and mucous membranes are normal. No oropharyngeal exudate.  Eyes: Conjunctivae and EOM are normal. Pupils are equal, round, and reactive to light.  Neck: Normal range of motion. Neck supple. No JVD present. Carotid bruit is not present.  Cardiovascular: Normal rate, regular rhythm, normal heart sounds and intact distal pulses.   Pulmonary/Chest: Effort normal and breath sounds normal. No stridor. She has no wheezes. She has no rales.  Abdominal: Soft. Bowel sounds are normal. She exhibits no fluid wave and no mass. There is no tenderness. There is no rebound and no guarding.  Musculoskeletal: Normal range of motion. She exhibits no edema, tenderness or deformity.       Right hip: Normal.       Left hip: Normal.        Right knee: Normal.       Left knee: Normal.       Right ankle: Normal. Achilles tendon normal.       Left ankle: Normal. Achilles tendon normal.       Cervical back: Normal.       Thoracic back: Normal.       Lumbar back: Normal.  No cords or the lower extremities, intact distal pulses, all compartments soft, no stepoffs or crepitus of the c t or l spine  Neurological: She is alert and oriented to person, place, and time. She displays normal reflexes. She exhibits normal muscle tone.  Skin: Skin is warm and dry. Capillary refill takes less than 2 seconds.  Psychiatric: She has a normal mood and affect.  Nursing note and vitals reviewed.    ED Treatments / Results  DIAGNOSTIC STUDIES: Oxygen Saturation is 100% on RA, NL by my interpretation.    COORDINATION OF CARE: 12:40 AM-Discussed next steps with pt. Pt verbalized understanding and  is agreeable with the plan. Will review records and reassess.  Radiology No results found.  Procedures Procedures (including critical care time)  Medications Ordered in ED  Medications  ketorolac (TORADOL) injection 60 mg (60 mg Intramuscular Given 05/23/16 0044)     Final Clinical Impressions(s) / ED Diagnoses  Chronic back pain:  No acute findings on exam. I do not feel imaging is necessary at this time and have reviewed recent outside imaging via care everywhere.  Return immediately for weakness, numbness, inability to walk, inability to urinate or leakage of stool, loss of sensation inability to walk fevers or any concerns.  Follow up with pain management as previously directed.    I have reviewed the triage vital signs and the nursing notes. Pertinent labs &imaging results that were available during my care of the patient were reviewed by me and considered in my medical decision making (see chart for details). The patient is nontoxic-appearing on exam and vital signs are within normal limits.    After history, exam, and medical workup I  feel the patient has been appropriately medically screened and is safe for discharge home. Pertinent diagnoses were discussed with the patient. Patient was given return precautions.   I personally performed the services described in this documentation, which was scribed in my presence. The recorded information has been reviewed and is accurate.      Cy BlamerApril Crystie Yanko, MD 05/23/16 [phone]

## 2016-09-09 DIAGNOSIS — M4125 Other idiopathic scoliosis, thoracolumbar region: Secondary | ICD-10-CM | POA: Insufficient documentation

## 2017-03-02 ENCOUNTER — Other Ambulatory Visit: Payer: Self-pay | Admitting: Family Medicine

## 2017-03-02 DIAGNOSIS — R1011 Right upper quadrant pain: Secondary | ICD-10-CM

## 2017-03-16 ENCOUNTER — Telehealth: Payer: Self-pay | Admitting: Neurology

## 2017-03-16 NOTE — Telephone Encounter (Signed)
That's fine. thanks

## 2017-03-16 NOTE — Telephone Encounter (Signed)
Patient is wanting to switch her care to Dr. Lucia GaskinsAhern from Dr. Frances FurbishAthar, is this ok? Please advise.

## 2017-03-16 NOTE — Telephone Encounter (Signed)
Okay with me 

## 2017-04-17 ENCOUNTER — Other Ambulatory Visit: Payer: Self-pay

## 2017-05-11 ENCOUNTER — Ambulatory Visit: Payer: BLUE CROSS/BLUE SHIELD | Admitting: Neurology

## 2017-05-11 ENCOUNTER — Telehealth: Payer: Self-pay | Admitting: Neurology

## 2017-05-11 ENCOUNTER — Encounter: Payer: Self-pay | Admitting: Neurology

## 2017-05-11 VITALS — BP 123/79 | HR 70 | Ht 65.0 in | Wt 141.8 lb

## 2017-05-11 DIAGNOSIS — R29818 Other symptoms and signs involving the nervous system: Secondary | ICD-10-CM

## 2017-05-11 DIAGNOSIS — R5382 Chronic fatigue, unspecified: Secondary | ICD-10-CM | POA: Diagnosis not present

## 2017-05-11 DIAGNOSIS — R200 Anesthesia of skin: Secondary | ICD-10-CM

## 2017-05-11 DIAGNOSIS — G3281 Cerebellar ataxia in diseases classified elsewhere: Secondary | ICD-10-CM

## 2017-05-11 DIAGNOSIS — R531 Weakness: Secondary | ICD-10-CM

## 2017-05-11 DIAGNOSIS — R2990 Unspecified symptoms and signs involving the nervous system: Secondary | ICD-10-CM | POA: Diagnosis not present

## 2017-05-11 DIAGNOSIS — H547 Unspecified visual loss: Secondary | ICD-10-CM | POA: Diagnosis not present

## 2017-05-11 DIAGNOSIS — H532 Diplopia: Secondary | ICD-10-CM | POA: Diagnosis not present

## 2017-05-11 MED ORDER — NORTRIPTYLINE HCL 10 MG PO CAPS: 10.0000 mg | ORAL_CAPSULE | Freq: Every day | ORAL | 3 refills | Status: DC

## 2017-05-11 NOTE — Telephone Encounter (Signed)
Just an FYI,  Patient returned my call I informed her since her deductible hasn't been bet the cost would be about $1,111.75. I did offer the payment plan She informed she cannot afford that right now and will give me a call back when she is ready.

## 2017-05-11 NOTE — Telephone Encounter (Signed)
[employer] Auth: [id number] (exp. 05/11/17 to 06/09/17)  vm for pt to call back about scheduled

## 2017-05-11 NOTE — Patient Instructions (Addendum)
Labs MRI brain and cervical spine Emg/ncs  Visual evoked potential  Electromyoneurogram Electromyoneurogram is a test to check how well your muscles and nerves are working. This procedure includes the combined use of electromyogram (EMG) and nerve conduction study (NCS). EMG is used to look for muscular disorders. NCS, which is also called electroneurogram, measures how well your nerves are controlling your muscles. The procedures are usually performed together to check if your muscles and nerves are healthy. If the reaction to testing is abnormal, this can indicate disease or injury, such as peripheral nerve damage. Tell a health care provider about:  Any allergies you have.  All medicines you are taking, including vitamins, herbs, eye drops, creams, and over-the-counter medicines.  Any problems you or family members have had with anesthetic medicines.  Any blood disorders you have.  Any surgeries you have had.  Any medical conditions you have.  Any pacemaker you have. What are the risks? Generally, this is a safe procedure. However, problems may occur, including:  Infection where the electrodes were inserted.  Bleeding.  What happens before the procedure?  Ask your health care provider about: ? Changing or stopping your regular medicines. This is especially important if you are taking diabetes medicines or blood thinners. ? Taking medicines such as aspirin and ibuprofen. These medicines can thin your blood. Do not take these medicines before your procedure if your health care provider instructs you not to.  Your health care provider may ask you to avoid: ? Caffeine, such as coffee and tea. ? Nicotine. This includes cigarettes and anything with tobacco.  Do not use lotions or creams on the same day that you will be having the procedure. What happens during the procedure? For EMG:  Your health care provider will ask you to stay in a position so that he or she can access  the muscle that will be studied. You may be standing, sitting down, or lying down.  You may be given a medicine that numbs the area (local anesthetic).  A very thin needle that has an electrode on it will be inserted into your muscle.  Another small electrode will be placed on your skin near the muscle.  Your health care provider will ask you to continue to remain still.  The electrodes will send a signal that tells about the electrical activity of your muscles. You may see this on a monitor or hear it in the room.  After your muscles have been studied at rest, your health care provider will ask you to contract or flex your muscles. The electrodes will send a signal that tells about the electrical activity of your muscles.  Your health care provider will remove the electrodes and the electrode needles when the procedure is finished. The procedure may vary among health care providers and hospitals. For NCS:  An electrode that records your nerve activity (recording electrode) will be placed on your skin by the muscle that is being studied.  An electrode that is used as a reference (reference electrode) will be placed near the recording electrode.  A paste or gel will be applied to your skin between the recording electrode and the reference electrode.  Your nerve will be stimulated with a mild shock. Your health care provider will measure how much time it takes for your muscle to react.  Your health care provider will remove the electrodes and the gel when the procedure is finished. The procedure may vary among health care providers and hospitals. What happens  after the procedure?  It is your responsibility to obtain your test results. Ask your health care provider or the department performing the test when and how you will get your results.  Your health care provider may: ? Give you medicines for any pain. ? Monitor the insertion sites to make sure that they stop bleeding. This  information is not intended to replace advice given to you by your health care provider. Make sure you discuss any questions you have with your health care provider. Document Released: 05/09/2004 Document Revised: 06/14/2015 Document Reviewed: 02/27/2014 Elsevier Interactive Patient Education  2018 [employer]

## 2017-05-11 NOTE — Progress Notes (Signed)
[facility]    Provider:  Dr Lucia Gaskins Referring Provider: Deatra James, MD Primary Care Physician:  Deatra James, MD  CC: "I have no solution a reason why I have been in excruciating pain with more and more symptoms"  HPI:  Terry Schneider is a 27 y.o. female here as a referral from Dr. Fredric Mare for muscle weakness. She has not been seen in over 3 years in our practice last by Dr. Frances Furbish.  Past medical history fibromyalgia, depression, eating disorder, history of concussion December 2015 who was seen for daily headaches.  Symptoms started 5 years ago after rehab for anorexia, she went back to school and was feeling well and started having "pain all over my body" it was generalized fatigue and multiple pain sites aching she was diagned with Fibromyalgia. She couldn't get out of bed. It has progressed to be more weak on the right side, throbbing pain, weakness on the right side, dragging her leg, she has used a cane and walker the , numbness of the right leg, falls. Symptoms are also in the face. Blurry vision, diplopia, vision loss in the left eye. Her hand will cramp up. She is going to integrative therapies and they are helping her "hang on". Headaches have improved. She has sleeping problem, she wakes up in excruciating pain in bed.   Medications tried include gabapentin, Topamax, enzyme CoQ10, Cymbalta clonazepam, trazodone, integrative therapies, physical therapy, pain management, Robaxin, Flexeril, Lamictal, meloxicam, Reglan, Zofran, prednisone, ziprasidone, Brintellix, Amitriptyline  Reviewed notes, labs and imaging from outside physicians, which showed:  FINDINGS: Reviewed report 08/2016  #  Ventricles: Normal without dilatation, mass effect, or midline shift. #  Brain parenchyma: No areas of abnormally increased or abnormally decreased signal intensity are identified.  Diffusion-weighted images are normal indicating no acute infarct.  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 No compression of the adjacent cerebellum.  No abnormal enhancement. #  Sella and parasellar region: Normal. #  Craniovertebral junction: Normal. #  Mastoids and paranasal sinuses: Normal #  Orbits: Normal #  Major cerebral vessels and dural sinuses: Normal flow #  Bony structures and scalp: Normal   IMPRESSION:  Enlarged CSF space posterior to the cerebellar hemispheres. 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 No compression of the adjacent cerebellum. Otherwise normal MRI of the brain.  Reviewed CT of the head images from December 2015 which showed a normal brain however there was some soft tissue injury but no acute fracture.   Reviewed prior neurology notes.  She was seen for recurrent headaches.  She was seen for daily headaches.  In addition she reported neck pain, tremors or shaking spells, feeling groggy, memory loss, blurry vision, feeling off.  At the time she was seen she was still having daily headaches possibly exacerbated by her fall and head trauma 2 months prior.  She was feeling confused and groggy as well likely postconcussive syndrome.  Medications tried appear to be Topamax, gabapentin, Q enzyme Q10, melatonin, Cymbalta.  She also had a multitude of complaints, pain all over her head and neck, trouble concentrating, shaking all over at times, also on Norco, clonazepam and trazodone, she became loud and tearful.  She went to integrative therapies in the past and came out much worse.  She was not interested in pursuing physical therapy.  Review of Systems: Patient complains of symptoms per HPI as well as the following symptoms: Memory loss, confusion, headache,  numbness, weakness, slurred speech, insomnia, restless legs, loss of vision, eye pain, joint pain, not enough sleep, aching muscles, fatigue. Pertinent negatives and positives per HPI. All others negative.   Social History    Socioeconomic History  . Marital status: Single    Spouse name: n/a  . Number of children: 0  . Years of education: 313  . Highest education level: Not on file  Occupational History  . Occupation: Social workernanny  . Occupation: Engineer, technical salestutor  Social Needs  . Financial resource strain: Not on file  . Food insecurity:    Worry: Not on file    Inability: Not on file  . Transportation needs:    Medical: Not on file    Non-medical: Not on file  Tobacco Use  . Smoking status: Former Smoker    Last attempt to quit: 01/21/2012    Years since quitting: 5.3  . Smokeless tobacco: Never Used  Substance and Sexual Activity  . Alcohol use: No  . Drug use: No  . Sexual activity: Yes    Birth control/protection: IUD  Lifestyle  . Physical activity:    Days per week: Not on file    Minutes per session: Not on file  . Stress: Not on file  Relationships  . Social connections:    Talks on phone: Not on file    Gets together: Not on file    Attends religious service: Not on file    Active member of club or organization: Not on file    Attends meetings of clubs or organizations: Not on file    Relationship status: Not on file  . Intimate partner violence:    Fear of current or ex partner: Not on file    Emotionally abused: Not on file    Physically abused: Not on file    Forced sexual activity: Not on file  Other Topics Concern  . Not on file  Social History Narrative   Lives home alone.  Works [employer]  [employer].    No contact with her father since 2013.  Consumes caffeine frequently daily.    Family History  Problem Relation Age of Onset  . Alcohol abuse Father   . Graves' disease Father   . Stroke Paternal Grandfather   . Breast cancer Paternal Grandmother   . Stroke Paternal Uncle     Past Medical History:  Diagnosis Date  . Anorexia   . Anxiety   . Arthritis   . Asthma   . Chronic pain   . Depression   . Eating disorder    anorexia  . Fibromyalgia    . Gluten intolerance   . Neuropathy   . Seizures (HCC)     Past Surgical History:  Procedure Laterality Date  . shock treatments     x21,electric treatments  . TYMPANOSTOMY TUBE PLACEMENT      Current Outpatient Medications  Medication Sig Dispense Refill  . OVER THE COUNTER MEDICATION Hemp 15mg  capsule, takes one daily    . BIOTIN PO Take 1 tablet by mouth daily.    . cholecalciferol (VITAMIN D) 1000 units tablet Take 1,000 Units by mouth daily.    Marland Kitchen. ibuprofen (ADVIL,MOTRIN) 200 MG tablet Take 400 mg by mouth every 6 (six) hours as needed for mild pain.    Marland Kitchen. lidocaine (LIDODERM) 5 % Place 1 patch onto the skin daily. Remove & Discard patch within 12 hours or as directed by MD (Patient not taking: Reported on 05/11/2017) 14 patch  0  . MELATONIN PO Take 1 tablet by mouth at bedtime as needed (sleep).    . meloxicam (MOBIC) 15 MG tablet Take 1 tablet by mouth daily.    . methocarbamol (ROBAXIN) 500 MG tablet Take 1 tablet (500 mg total) by mouth 2 (two) times daily. (Patient not taking: Reported on 05/11/2017) 10 tablet 0  . Multiple Vitamin (MULTIVITAMIN WITH MINERALS) TABS tablet Take 1 tablet by mouth daily.    . nortriptyline (PAMELOR) 10 MG capsule Take 1 capsule (10 mg total) by mouth at bedtime. 60 capsule 3  . traZODone (DESYREL) 150 MG tablet Take 150 mg by mouth at bedtime.  0   No current facility-administered medications for this visit.     Allergies as of 05/11/2017 - Review Complete 05/11/2017  Allergen Reaction Noted  . Rexulti [brexpiprazole] Rash 12/07/2013    Vitals: BP 123/79   Pulse 70   Ht 5\' 5"  (1.651 m)   Wt 141 lb 12.8 oz (64.3 kg)   BMI 23.60 kg/m  Last Weight:  Wt Readings from Last 1 Encounters:  05/11/17 141 lb 12.8 oz (64.3 kg)   Last Height:   Ht Readings from Last 1 Encounters:  05/11/17 5\' 5"  (1.651 m)   Physical exam: Exam: Gen: NAD, conversant, well nourised, well groomed                     CV: RRR, no MRG. No Carotid Bruits. No  peripheral edema, warm, nontender Eyes: Conjunctivae clear without exudates or hemorrhage  Neuro: Detailed Neurologic Exam  Speech:    Speech is normal; fluent and spontaneous with normal comprehension.  Cognition:    The patient is oriented to person, place, and time;     recent and remote memory intact;     language fluent;     normal attention, concentration,     fund of knowledge Cranial Nerves:    The pupils are equal, round, and reactive to light. The fundi are normal and spontaneous venous pulsations are present. Visual fields are full to finger confrontation. Extraocular movements are intact. Trigeminal sensation is impaired on the right side, the muscles of mastication are normal. The face is symmetric. The palate elevates in the midline. Hearing intact. Voice is normal. Shoulder shrug is normal. The tongue has normal motion without fasciculations.   Coordination:    Normal finger to nose and heel to shin. Normal rapid alternating movements.   Gait:    Heel-toe and tandem gait are normal.   Motor Observation:    No asymmetry, no atrophy, and no involuntary movements noted. Tone:    Normal muscle tone.    Posture:    Posture is normal. normal erect    Strength:    Generalized 4-4+/5 right sided weakness     Sensation: decreased pin prick right arm and leg     Reflex Exam:  DTR's:    Deep tendon reflexes in the upper and lower extremities are normal bilaterally.   Toes:    The toes are downgoing bilaterally.   Clonus:    Clonus is absent.       Assessment/Plan: 27 year old patient with multiple neurologic complaints and progressive "excruciating pain with more and more symptoms" with a history of what appears to be significant psychiatric overlay to her chronic pain symptoms.  Past medical history fibromyalgia, depression, anxiety, chronic pain.  Need MRI of the brain and cervical spine to evaluate for multiple sclerosis due to focal neurologic symptoms,  vision  loss, numbness, weakness  Emg/ncs of the right arm and leg  Extensive labs today  Visual evoked potentials  Orders Placed This Encounter  Procedures  . MR BRAIN W WO CONTRAST  . MR CERVICAL SPINE W WO CONTRAST  . Vitamin B1  . Hemoglobin A1c  . TSH  . HIV antibody  . Sedimentation rate  . ANA w/Reflex  . Sjogren's syndrome antibods(ssa + ssb)  . Pan-ANCA  . B12 and Folate Panel  . RPR  . Hepatitis C antibody  . Rheumatoid factor  . Heavy metals, blood  . Vitamin B6  . Methylmalonic acid, serum  . Comprehensive metabolic panel  . CBC  . Vitamin D, 25-hydroxy  . Visual evoked potential test  . NCV with EMG(electromyography)     Naomie Dean, MD  [facility] [address] [place], [place] [address]  Phone [phone] Fax [phone]

## 2017-05-12 ENCOUNTER — Ambulatory Visit: Payer: BLUE CROSS/BLUE SHIELD | Admitting: Neurology

## 2017-06-01 ENCOUNTER — Telehealth: Payer: Self-pay | Admitting: Neurology

## 2017-06-01 NOTE — Telephone Encounter (Signed)
Noted that pt called and scheduled visual evoked potentials test.

## 2017-06-01 NOTE — Telephone Encounter (Signed)
Pt requesting a call back to discuss an "eye test" Dr. Lucia Gaskins had mentioned. Pt stated she is anting that done before thr EMG/NCV

## 2017-06-01 NOTE — Telephone Encounter (Signed)
Called pt and LVM asking for call back. Informed her the eye test was the visual evoked potential test. If she would like to have the scheduled we can do that when she calls back. Also asked about the EMG as RN noticed that an appt had been canceled for 6/6. Left office number and hours in message.

## 2017-06-25 ENCOUNTER — Encounter: Payer: BLUE CROSS/BLUE SHIELD | Admitting: Neurology

## 2017-07-02 ENCOUNTER — Ambulatory Visit: Payer: BLUE CROSS/BLUE SHIELD | Admitting: Neurology

## 2017-07-02 DIAGNOSIS — H538 Other visual disturbances: Secondary | ICD-10-CM

## 2017-07-02 DIAGNOSIS — R2990 Unspecified symptoms and signs involving the nervous system: Secondary | ICD-10-CM

## 2017-07-02 NOTE — Progress Notes (Signed)
    History:  She is a 27 year old woman with blurry vision and some visual loss in the left eye.  Description: The visual evoked response test was performed today using 32 x 32 check sizes. The absolute latencies for the N1 and the P100 wave forms were within normal limits bilaterally.   The P100 latency 109 ms on the left and 107 ms on the right.. The amplitudes for the P100 wave forms were also within normal limits bilaterally.    Impression:  The visual evoked response test above was within normal limits bilaterally. No evidence of conduction slowing was seen within the anterior visual pathways on either side on today's evaluation.  Richard A. Epimenio FootSater, MD, PhD, FAAN Certified in Neurology, Clinical Neurophysiology, Sleep Medicine, Pain Medicine and Neuroimaging Director, [facility]  [facility] [address], [address] [place], [place] [address] [phone]

## 2017-07-20 ENCOUNTER — Other Ambulatory Visit: Payer: Self-pay | Admitting: Neurology

## 2017-07-20 ENCOUNTER — Telehealth: Payer: Self-pay | Admitting: Neurology

## 2017-07-20 NOTE — Telephone Encounter (Signed)
Called the patient. No answer. LVM for the pt to call back so that we can discuss what Dr Lucia GaskinsAhern stated.

## 2017-07-20 NOTE — Telephone Encounter (Signed)
Pt requesting a call to discuss the results from her visual evoked potentials, also how to move forward with the pain. Please call to advise

## 2017-07-20 NOTE — Telephone Encounter (Addendum)
The visual evoked potentials were within normal limits, this tests the vision structures in the eyes and optic nerves.I ordered MRi brain and cervical spine, doesn;t appear she has completed all my recommended tests please discuss with ehr

## 2017-07-21 NOTE — Telephone Encounter (Signed)
Pt returned the call. I was able to review the results of the vision test completed by Dr Epimenio FootSater for the pt. The results were WNL. I questioned if the pt had her MRI's completed she states that due to last of insurance and money she was unable to pay and so she has been doing one test at a time. Pt would like to see how much the MRI's would cost. She is still having vision loss behind the left eye and pressure built up behind the left eye and is in pain. Informed her that unfortunatetly hard to treat if we don't know what we are treating and that our hope is that we would find a explanation by doing the test. I will send her question to our MRI coordinator to contact the patient to review what her payment options would be for the MRI's. Pt verbalized understanding

## 2017-07-21 NOTE — Telephone Encounter (Signed)
I spoke to the patient and I informed her that it is still showing that she has BCBS and I told her that if she still had BCBS it would cost her about $3,239.16 because she has a deductable of $7,000 she stated that she does not have BCBS because she has not paid for it for a few months.. I also gave her the self pay it would be about $1,739.25. She stated she is going to hold of and give me a call back later when she has the money.

## 2018-04-01 ENCOUNTER — Telehealth: Payer: Self-pay

## 2018-04-01 NOTE — Telephone Encounter (Signed)
Copied from CRM [id number]. Topic: Referral - Request for Referral >> Apr 01, 2018  2:51 PM Wyonia Hough E wrote: Has patient seen PCP for this complaint? No - New Pt to establish care has an appt on 8.3.20 with Helane Rima but needs a referral / Pt heard good things about Earlene Plater and is willing to wait to be her Pt but Dr. Newell Coral advised Pt to get a referral from a PCP / please advise *If NO, is insurance requiring patient see PCP for this issue before PCP can refer them? Referral for which specialty: Neurosurgery  Preferred provider/office: Dr. Newell Coral /[address]  Reason for referral: back issues and provider wanted Pt to have a referral for an MRI >> Apr 01, 2018  3:01 PM Pauline Aus B wrote: Please advise, patient is not established. Can patient be worked in sooner to get referral?   Called patient l/m to call office we can not send referral until she has been seen in our office.

## 2018-04-02 NOTE — Telephone Encounter (Signed)
Pt returned office call. Made pt aware of Dr. Earlene Plater response, pt expressed understanding.

## 2018-07-08 ENCOUNTER — Telehealth: Payer: Self-pay | Admitting: Family Medicine

## 2018-07-08 NOTE — Telephone Encounter (Signed)
Copied from [place] [id number]. Topic: Quick Communication - See Telephone Encounter >> Jul 08, 2018  2:22 PM Blase Mess A wrote: CRM for notification. See Telephone encounter for: 07/08/18. Patient is calling to have CPE done. And is requesting a appointment for Chronic Pain, neck pain, she wants to bring in x-rays. Please advise [phone]- [phone]

## 2018-07-09 NOTE — Telephone Encounter (Signed)
Patient scheduled, 40 min slot.

## 2018-07-09 NOTE — Telephone Encounter (Signed)
Left message on voicemail to call office. Wanted to ask if 08/23/18 was ok or needed something sooner.

## 2018-07-09 NOTE — Telephone Encounter (Signed)
I spoke with pt and she informed me that she would like to be seen sooner than 08/23/18.  Patient c/o having some ongoing pain and also would like to have a physical before July 6, due to camp/work.  I see availability on Monday, 22 in the pm.  I informed patient that I would check to see if that would be ok with Dr. Juleen China and call her back.

## 2018-07-09 NOTE — Telephone Encounter (Signed)
Sending back to you. Okay - 40 min.

## 2018-07-13 LAB — HM PAP SMEAR: HM Pap smear: NEGATIVE

## 2018-07-14 ENCOUNTER — Telehealth: Payer: Self-pay | Admitting: General Practice

## 2018-07-14 NOTE — Telephone Encounter (Signed)
Patient need her CPE before July 6th to start her new job. But since Dr.Wallace is out sick we cancelled it and was going to see if she either can be worked in or she can see another provider for her CPE.

## 2018-07-14 NOTE — Telephone Encounter (Signed)
Called pt and lvm to schedule cpe with sam

## 2018-07-14 NOTE — Telephone Encounter (Signed)
Call and see if she wants CPE with sam

## 2018-07-16 ENCOUNTER — Ambulatory Visit: Payer: Self-pay | Admitting: Family Medicine

## 2018-07-19 ENCOUNTER — Encounter: Payer: Self-pay | Admitting: Physician Assistant

## 2018-08-23 ENCOUNTER — Encounter: Payer: Self-pay | Admitting: Family Medicine

## 2018-08-23 ENCOUNTER — Ambulatory Visit (INDEPENDENT_AMBULATORY_CARE_PROVIDER_SITE_OTHER): Payer: BC Managed Care – PPO | Admitting: Family Medicine

## 2018-08-23 ENCOUNTER — Encounter

## 2018-08-23 ENCOUNTER — Other Ambulatory Visit: Payer: Self-pay

## 2018-08-23 ENCOUNTER — Telehealth: Payer: Self-pay | Admitting: Family Medicine

## 2018-08-23 VITALS — BP 110/80 | HR 76 | Temp 98.2°F | Ht 65.0 in | Wt 140.2 lb

## 2018-08-23 DIAGNOSIS — R5381 Other malaise: Secondary | ICD-10-CM

## 2018-08-23 DIAGNOSIS — R2990 Unspecified symptoms and signs involving the nervous system: Secondary | ICD-10-CM | POA: Diagnosis not present

## 2018-08-23 DIAGNOSIS — F5000 Anorexia nervosa, unspecified: Secondary | ICD-10-CM

## 2018-08-23 DIAGNOSIS — E559 Vitamin D deficiency, unspecified: Secondary | ICD-10-CM

## 2018-08-23 DIAGNOSIS — G894 Chronic pain syndrome: Secondary | ICD-10-CM

## 2018-08-23 DIAGNOSIS — Z789 Other specified health status: Secondary | ICD-10-CM | POA: Diagnosis not present

## 2018-08-23 DIAGNOSIS — G8929 Other chronic pain: Secondary | ICD-10-CM

## 2018-08-23 DIAGNOSIS — R51 Headache: Secondary | ICD-10-CM

## 2018-08-23 DIAGNOSIS — Z13220 Encounter for screening for lipoid disorders: Secondary | ICD-10-CM

## 2018-08-23 DIAGNOSIS — Z975 Presence of (intrauterine) contraceptive device: Secondary | ICD-10-CM

## 2018-08-23 DIAGNOSIS — R5382 Chronic fatigue, unspecified: Secondary | ICD-10-CM | POA: Diagnosis not present

## 2018-08-23 DIAGNOSIS — R519 Headache, unspecified: Secondary | ICD-10-CM

## 2018-08-23 DIAGNOSIS — IMO0002 Reserved for concepts with insufficient information to code with codable children: Secondary | ICD-10-CM

## 2018-08-23 DIAGNOSIS — F4310 Post-traumatic stress disorder, unspecified: Secondary | ICD-10-CM

## 2018-08-23 DIAGNOSIS — Z915 Personal history of self-harm: Secondary | ICD-10-CM

## 2018-08-23 LAB — CBC WITH DIFFERENTIAL/PLATELET
Basophils Absolute: 0 10*3/uL (ref 0.0–0.1)
Basophils Relative: 0.5 % (ref 0.0–3.0)
Eosinophils Absolute: 0.1 10*3/uL (ref 0.0–0.7)
Eosinophils Relative: 2.4 % (ref 0.0–5.0)
HCT: 42.8 % (ref 36.0–46.0)
Hemoglobin: 14.5 g/dL (ref 12.0–15.0)
Lymphocytes Relative: 28.8 % (ref 12.0–46.0)
Lymphs Abs: 1.6 10*3/uL (ref 0.7–4.0)
MCHC: 33.9 g/dL (ref 30.0–36.0)
MCV: 96.1 fl (ref 78.0–100.0)
Monocytes Absolute: 0.3 10*3/uL (ref 0.1–1.0)
Monocytes Relative: 6.1 % (ref 3.0–12.0)
Neutro Abs: 3.4 10*3/uL (ref 1.4–7.7)
Neutrophils Relative %: 62.2 % (ref 43.0–77.0)
Platelets: 203 10*3/uL (ref 150.0–400.0)
RBC: 4.45 Mil/uL (ref 3.87–5.11)
RDW: 12.5 % (ref 11.5–15.5)
WBC: 5.5 10*3/uL (ref 4.0–10.5)

## 2018-08-23 LAB — COMPREHENSIVE METABOLIC PANEL
ALT: 13 U/L (ref 0–35)
AST: 15 U/L (ref 0–37)
Albumin: 4.8 g/dL (ref 3.5–5.2)
Alkaline Phosphatase: 40 U/L (ref 39–117)
BUN: 9 mg/dL (ref 6–23)
CO2: 26 mEq/L (ref 19–32)
Calcium: 9.8 mg/dL (ref 8.4–10.5)
Chloride: 104 mEq/L (ref 96–112)
Creatinine, Ser: 0.59 mg/dL (ref 0.40–1.20)
GFR: 121.06 mL/min (ref >60.00)
Glucose, Bld: 90 mg/dL (ref 70–99)
Potassium: 4.1 mEq/L (ref 3.5–5.1)
Sodium: 139 mEq/L (ref 135–145)
Total Bilirubin: 0.5 mg/dL (ref 0.2–1.2)
Total Protein: 7.1 g/dL (ref 6.0–8.3)

## 2018-08-23 LAB — LIPID PANEL
Cholesterol: 156 mg/dL (ref 0–200)
HDL: 50.4 mg/dL (ref >39.00)
LDL Cholesterol: 90 mg/dL (ref 0–99)
NonHDL: 105.89
Total CHOL/HDL Ratio: 3
Triglycerides: 77 mg/dL (ref 0.0–149.0)
VLDL: 15.4 mg/dL (ref 0.0–40.0)

## 2018-08-23 LAB — SEDIMENTATION RATE: Sed Rate: 3 mm/hr (ref 0–20)

## 2018-08-23 LAB — TSH: TSH: 2.04 u[IU]/mL (ref 0.35–4.50)

## 2018-08-23 LAB — VITAMIN B12: Vitamin B-12: 212 pg/mL (ref 211–911)

## 2018-08-23 LAB — T4, FREE: Free T4: 0.9 ng/dL (ref 0.60–1.60)

## 2018-08-23 LAB — URIC ACID: Uric Acid, Serum: 3.7 mg/dL (ref 2.4–7.0)

## 2018-08-23 LAB — MAGNESIUM: Magnesium: 1.8 mg/dL (ref 1.5–2.5)

## 2018-08-23 LAB — VITAMIN D 25 HYDROXY (VIT D DEFICIENCY, FRACTURES): VITD: 26.84 ng/mL — ABNORMAL LOW (ref 30.00–100.00)

## 2018-08-23 LAB — C-REACTIVE PROTEIN: CRP: <1 mg/dL (ref 0.5–20.0)

## 2018-08-23 NOTE — Telephone Encounter (Signed)
See note  Copied from [place] [phone]. Topic: Quick Communication - See Telephone Encounter >> Aug 23, 2018  4:30 PM Loma Boston wrote: CRM for notification. See Telephone encounter for: 08/23/18. This was a referral of [facility] . Office request to send note of referral received /made on this appt to Dr Terri Piedra Fax [phone]

## 2018-08-23 NOTE — Patient Instructions (Signed)
[email or web address]     

## 2018-08-25 LAB — IRON,TIBC AND FERRITIN PANEL
%SAT: 32 % (calc) (ref 16–45)
Ferritin: 35 ng/mL (ref 16–154)
Iron: 113 ug/dL (ref 40–190)
TIBC: 356 mcg/dL (calc) (ref 250–450)

## 2018-08-25 LAB — ANTI-NUCLEAR AB-TITER (ANA TITER): ANA Titer 1: 1:40 {titer} — ABNORMAL HIGH

## 2018-08-25 LAB — ANA: Anti Nuclear Antibody (ANA): POSITIVE — AB

## 2018-08-25 LAB — RHEUMATOID FACTOR: Rhuematoid fact SerPl-aCnc: <14 IU/mL (ref <14)

## 2018-08-26 NOTE — Telephone Encounter (Signed)
Hold for note not done yet.

## 2018-08-29 ENCOUNTER — Encounter: Payer: Self-pay | Admitting: Family Medicine

## 2018-08-29 DIAGNOSIS — Z915 Personal history of self-harm: Secondary | ICD-10-CM | POA: Insufficient documentation

## 2018-08-29 DIAGNOSIS — Z789 Other specified health status: Secondary | ICD-10-CM | POA: Insufficient documentation

## 2018-08-29 DIAGNOSIS — F4310 Post-traumatic stress disorder, unspecified: Secondary | ICD-10-CM | POA: Insufficient documentation

## 2018-08-29 DIAGNOSIS — IMO0002 Reserved for concepts with insufficient information to code with codable children: Secondary | ICD-10-CM | POA: Insufficient documentation

## 2018-08-29 DIAGNOSIS — R519 Headache, unspecified: Secondary | ICD-10-CM | POA: Insufficient documentation

## 2018-08-29 DIAGNOSIS — F5000 Anorexia nervosa, unspecified: Secondary | ICD-10-CM | POA: Insufficient documentation

## 2018-08-29 DIAGNOSIS — G8929 Other chronic pain: Secondary | ICD-10-CM | POA: Insufficient documentation
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
## 2018-08-29 NOTE — Assessment & Plan Note (Signed)
Doing well. No drive to restrict.

## 2018-08-29 NOTE — Assessment & Plan Note (Addendum)
Patient brings in written report of symptoms and previous treatments.  - extreme pain on right side, hip and leg go numb - left eye vision goes in and out - difficult to sleep due to pain - chest/ribcage pain during flare - sometimes pain is so severe, she will wear Depends at night so that she does not have to get out of bed - morning and night worst for flares - hard to play violin, which she loves to do - tingling sensations  - followed by Chiropractor for the last few years - minimal relief - has been on multiple vitamins and supplements (tumeric, magnesium, ginger, vitamin D) - dry needling with minimal relief - PT with minimal relief though showers with stretching help in the moment - healthy diet focusing on protein and raw vegetables - hot and cold compression - acupuncture with minimal relief - "candida" treatment with no relie  Chart review: Medications tried include gabapentin, Topamax, enzyme CoQ10, Cymbalta clonazepam, trazodone, integrative therapies, physical therapy, pain management, Robaxin, Flexeril, Lamictal, meloxicam, Reglan, Zofran, prednisone, ziprasidone, Brintellix, Amitriptyline.
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
## 2018-08-29 NOTE — Assessment & Plan Note (Addendum)
Pain is behind eye sockets, with vision changes. History of Neurology and Ophthalmology evaluations. Previous MRI.

## 2018-08-30 ENCOUNTER — Other Ambulatory Visit: Payer: Self-pay | Admitting: Family Medicine

## 2018-08-30 DIAGNOSIS — R5382 Chronic fatigue, unspecified: Secondary | ICD-10-CM

## 2018-08-30 DIAGNOSIS — M791 Myalgia, unspecified site: Secondary | ICD-10-CM

## 2018-08-30 DIAGNOSIS — R768 Other specified abnormal immunological findings in serum: Secondary | ICD-10-CM

## 2018-08-30 DIAGNOSIS — R5381 Other malaise: Secondary | ICD-10-CM

## 2018-08-31 NOTE — Telephone Encounter (Signed)
Note faxed.

## 2018-10-11 ENCOUNTER — Encounter: Payer: Self-pay | Admitting: Family Medicine

## 2018-10-11 ENCOUNTER — Ambulatory Visit (INDEPENDENT_AMBULATORY_CARE_PROVIDER_SITE_OTHER): Payer: BC Managed Care – PPO | Admitting: Family Medicine

## 2018-10-11 VITALS — Ht 65.0 in | Wt 140.0 lb

## 2018-10-11 DIAGNOSIS — J039 Acute tonsillitis, unspecified: Secondary | ICD-10-CM | POA: Diagnosis not present

## 2018-10-11 MED ORDER — CEFDINIR 300 MG PO CAPS: 300.0000 mg | ORAL_CAPSULE | Freq: Two times a day (BID) | ORAL | 0 refills | Status: DC

## 2018-10-11 NOTE — Patient Instructions (Signed)
Health Maintenance Due  Topic Date Due  . HIV Screening  04/14/2005  . PAP-Cervical Cytology Screening  11/22/2016  . PAP SMEAR-Modifier  11/22/2016  . INFLUENZA VACCINE  08/21/2018    Depression screen [facility] 2/9 08/23/2018 06/02/2013  Decreased Interest 0 0  Down, Depressed, Hopeless 0 0  PHQ - 2 Score 0 0  Altered sleeping 3 -  Tired, decreased energy 3 -  Change in appetite 0 -  Feeling bad or failure about yourself  0 -  Trouble concentrating 3 -  Moving slowly or fidgety/restless 2 -  Suicidal thoughts 0 -  PHQ-9 Score 11 -  Difficult doing work/chores Somewhat difficult -

## 2018-10-11 NOTE — Progress Notes (Signed)
Virtual Visit via Video   Due to the COVID-19 pandemic, this visit was completed with telemedicine (audio/video) technology to reduce patient and provider exposure as well as to preserve personal protective equipment.   I connected with Terry Schneider by a video enabled telemedicine application and verified that I am speaking with the correct person using two identifiers. Location patient: Home Location provider: [facility], Office Persons participating in the virtual visit: Mery Guadalupe, Helane Rima, DO Barnie Mort, CMA acting as scribe for Dr. Helane Rima.   I discussed the limitations of evaluation and management by telemedicine and the availability of in person appointments. The patient expressed understanding and agreed to proceed.  Care Team   Patient Care Team: Helane Rima, DO as PCP - General (Family Medicine) Oran Rein as Dietitian (Dietician) Milagros Evener, MD as Consulting Physician (Psychiatry) Vilinda Flake, PhD as Consulting Physician (Psychology) Freddy Finner, MD as Consulting Physician (Obstetrics and Gynecology) Edwinna Areola, DO as Consulting Physician (Obstetrics and Gynecology)  Subjective:   HPI:  Patient has had increased headaches with sore throat. She has had no productive cough. She denies any fever but has had chills. She has had trouble with swallowing. White spots left OP. Hx of strep and feels like this is strep.  Review of Systems  Constitutional: Negative for chills and fever.  HENT: Negative for hearing loss and tinnitus.   Eyes: Negative for blurred vision and double vision.  Respiratory: Positive for cough. Negative for wheezing.   Cardiovascular: Negative for chest pain, palpitations and leg swelling.  Gastrointestinal: Negative for nausea and vomiting.  Genitourinary: Negative for dysuria and urgency.  Musculoskeletal: Negative for myalgias.  Neurological: Negative for dizziness and headaches.    Psychiatric/Behavioral: Negative for depression and suicidal ideas.    Patient Active Problem List   Diagnosis Date Noted  . Vegetarian diet 08/29/2018  . Anorexia nervosa in remission, still seeing therapist q 2 weeks - [name] 08/29/2018  . History of self-harm, in remission for many years 08/29/2018  . PTSD (post-traumatic stress disorder), followed by Dr. Evelene Croon prn 08/29/2018  . Chronic headaches 08/29/2018  . Multiple neurological symptoms 05/11/2017  . Other idiopathic scoliosis, thoracolumbar region 09/09/2016  . Self-mutilation 06/02/2013  . Anxiety and depression 06/02/2013  . Chronic pain syndrome 06/02/2013    Social History   Tobacco Use  . Smoking status: Former Smoker    Quit date: 01/21/2012    Years since quitting: 6.7  . Smokeless tobacco: Never Used  Substance Use Topics  . Alcohol use: No    Current Outpatient Medications:  .  ibuprofen (ADVIL,MOTRIN) 200 MG tablet, Take 400 mg by mouth every 6 (six) hours as needed for mild pain., Disp: , Rfl:  .  levonorgestrel (MIRENA, 52 MG,) 20 MCG/24HR IUD, Mirena 20 mcg/24 hours (5 yrs) 52 mg intrauterine device  Take 1 device every day by intrauterine route., Disp: , Rfl:  .  Multiple Vitamin (MULTIVITAMIN WITH MINERALS) TABS tablet, Take 1 tablet by mouth daily., Disp: , Rfl:  .  cefdinir (OMNICEF) 300 MG capsule, Take 1 capsule (300 mg total) by mouth 2 (two) times daily., Disp: 20 capsule, Rfl: 0  Allergies  Allergen Reactions  . Prednisone   . Rexulti [Brexpiprazole] Rash    Objective:   VITALS: Per patient if applicable, see vitals. GENERAL: Alert, appears well and in no acute distress. HEENT: Atraumatic, conjunctiva clear, no obvious abnormalities on inspection of external nose and ears. NECK: Normal movements of the  head and neck. CARDIOPULMONARY: No increased WOB. Speaking in clear sentences. I:E ratio WNL.  MS: Moves all visible extremities without noticeable abnormality. PSYCH: Pleasant and  cooperative, well-groomed. Speech normal rate and rhythm. Affect is appropriate. Insight and judgement are appropriate. Attention is focused, linear, and appropriate.  NEURO: CN grossly intact. Oriented as arrived to appointment on time with no prompting. Moves both UE equally.  SKIN: No obvious lesions, wounds, erythema, or cyanosis noted on face or hands.  Depression screen [facility] 2/9 08/23/2018 06/02/2013  Decreased Interest 0 0  Down, Depressed, Hopeless 0 0  PHQ - 2 Score 0 0  Altered sleeping 3 -  Tired, decreased energy 3 -  Change in appetite 0 -  Feeling bad or failure about yourself  0 -  Trouble concentrating 3 -  Moving slowly or fidgety/restless 2 -  Suicidal thoughts 0 -  PHQ-9 Score 11 -  Difficult doing work/chores Somewhat difficult -    Assessment and Plan:   Terry Schneider was seen today for sore throat.  Diagnoses and all orders for this visit:  Exudative tonsillitis -     cefdinir (OMNICEF) 300 MG capsule; Take 1 capsule (300 mg total) by mouth 2 (two) times daily.   Marland Kitchen COVID-19 Education: The signs and symptoms of COVID-19 were discussed with the patient and how to seek care for testing if needed. The importance of social distancing was discussed today. . Reviewed expectations re: course of current medical issues. . Discussed self-management of symptoms. . Outlined signs and symptoms indicating need for more acute intervention. . Patient verbalized understanding and all questions were answered. Marland Kitchen Health Maintenance issues including appropriate healthy diet, exercise, and smoking avoidance were discussed with patient. . See orders for this visit as documented in the electronic medical record.  Briscoe Deutscher, DO  Records requested if needed. Time spent: 15 minutes, of which >50% was spent in obtaining information about her symptoms, reviewing her previous labs, evaluations, and treatments, counseling her about her condition (please see the discussed topics above), and  developing a plan to further investigate it; she had a number of questions which I addressed.

## 2018-10-18 ENCOUNTER — Telehealth: Payer: Self-pay | Admitting: Physical Therapy

## 2018-10-18 ENCOUNTER — Other Ambulatory Visit: Payer: Self-pay

## 2018-10-18 NOTE — Telephone Encounter (Signed)
I gave Joellen a note to call patient for consideration of integrative pain clinic and one other. It should be on her desk. Let me know if you have any issues.

## 2018-10-18 NOTE — Telephone Encounter (Signed)
I have information called both offices to gt info or referral process and turn a round

## 2018-10-18 NOTE — Telephone Encounter (Signed)
Copied from [place] [id number]. Topic: General - Inquiry >> Oct 18, 2018 10:17 AM Scherrie Gerlach wrote: Reason for CRM: pt states she is still not feeling any better. Her throat is better, but she has body fatigue and cannot get out of bed. Pt states she cannot hold a pencil. Pt states she is in extreme pain in legs and hands.  Would like to know where Dr Juleen China is at in checking on what she she do next. Pt states she is getting desperate. Pt lives alone and has no support. Please advise

## 2018-10-18 NOTE — Telephone Encounter (Signed)
Please see message and advise.  Thank you. ° °

## 2018-10-22 ENCOUNTER — Other Ambulatory Visit: Payer: Self-pay

## 2018-10-22 DIAGNOSIS — R2990 Unspecified symptoms and signs involving the nervous system: Secondary | ICD-10-CM

## 2018-10-22 DIAGNOSIS — R5381 Other malaise: Secondary | ICD-10-CM

## 2018-10-22 NOTE — Telephone Encounter (Signed)
Called and gave information she will call for appointment.

## 2018-11-24 ENCOUNTER — Other Ambulatory Visit: Payer: Self-pay

## 2018-11-24 DIAGNOSIS — Z20822 Contact with and (suspected) exposure to covid-19: Secondary | ICD-10-CM

## 2018-11-25 LAB — NOVEL CORONAVIRUS, NAA: SARS-CoV-2, NAA: NOT DETECTED

## 2019-01-24 ENCOUNTER — Other Ambulatory Visit: Payer: Self-pay

## 2019-01-24 ENCOUNTER — Encounter: Payer: Self-pay | Admitting: Family Medicine

## 2019-01-24 ENCOUNTER — Ambulatory Visit: Payer: BC Managed Care – PPO | Admitting: Family Medicine

## 2019-01-24 VITALS — BP 122/80 | HR 79 | Temp 98.2°F | Ht 65.0 in | Wt 136.4 lb

## 2019-01-24 DIAGNOSIS — R2990 Unspecified symptoms and signs involving the nervous system: Secondary | ICD-10-CM | POA: Diagnosis not present

## 2019-01-24 MED ORDER — TRAMADOL HCL 50 MG PO TABS: 50.0000 mg | ORAL_TABLET | Freq: Three times a day (TID) | ORAL | 0 refills | Status: AC | PRN

## 2019-01-24 NOTE — Patient Instructions (Signed)
It was very nice to see you today!  I am concerned you may have MS  Please [place] the tramadol if needed.  We will check an MRI.   Take care, Dr Jimmey Ralph  Please try these tips to maintain a healthy lifestyle:   Eat at least 3 REAL meals and 1-2 snacks per day.  Aim for no more than 5 hours between eating.  If you eat breakfast, please do so within one hour of getting up.    Each meal should contain half fruits/vegetables, one quarter protein, and one quarter carbs (no bigger than a computer mouse)   Cut down on sweet beverages. This includes juice, soda, and sweet tea.     Drink at least 1 glass of water with each meal and aim for at least 8 glasses per day   Exercise at least 150 minutes every week.

## 2019-01-24 NOTE — Progress Notes (Signed)
   Terry Schneider is a 29 y.o. female who presents today for an office visit.  Assessment/Plan:  Multiple Neurologic Problems Concern for multiple sclerosis given her multiple neurologic issues including pain, numbness, tingling, and vision changes.  We will obtain MRI of brain and cervical spine as per neurology's recommendation.  Given severity of her pain also start a course of tramadol.  Discussed reasons to return to care.  If MRI is unrevealing will consider referral back to neurology versus continuing with symptom management for now.    Subjective:  HPI:  Patient with multiple year history of chronic pain and multiple neurologic symptoms.  She established with a new PCP about 4 months ago and had extensive lab work at that time.  She had mildly positive ANA was sent to rheumatology.  Rheumatologist told her that her symptoms were not due to any autoimmune or rheumatologic illness and she was told to get with her PCP.  Symptoms seem to be worsening over the last few months.  She currently having most of the pain on the right side of her body.  She is also having some numbness and tingling the left side as well.  She has had pain in her left eye as well as blurry vision.  Pain has become much more severe.  Pain in the left is like a throbbing pain.  She also notices that sometimes she will slur her words and that her coworkers will noticed that her eye is drooping.  She has seen neurology in the past.  Had extensive lab work done a couple years ago.  Also had an MRI 2 years ago which did not show any significant findings.  She was recently following  with a neurologist last year however she stopped as she was unable to afford further testing.       Objective:  Physical Exam: BP 122/80   Pulse 79   Temp 98.2 F (36.8 C) (Temporal)   Ht 5\' 5"  (1.651 m)   Wt 136 lb 6.4 oz (61.9 kg)   SpO2 100%   BMI 22.70 kg/m   Gen: No acute distress, resting comfortably CV: Regular rate and rhythm  with no murmurs appreciated Pulm: Normal work of breathing, clear to auscultation bilaterally with no crackles, wheezes, or rhonchi Neuro: Cranial nerves II through XII intact.  Strength 5 out of 5 in upper and lower extremities. Psych: Normal affect and thought content  Time Spent: 45 minutes of total time was spent on the date of the encounter performing the following actions: chart review prior to seeing the patient, obtaining history, performing a medically necessary exam, counseling on the treatment plan, placing orders, and documenting in our EHR.        . Katina Degree, MD 01/24/2019 4:08 PM

## 2019-01-31 ENCOUNTER — Telehealth: Payer: Self-pay | Admitting: Family Medicine

## 2019-01-31 NOTE — Telephone Encounter (Signed)
Pt called stating she has a rash on both arms and it is very painful. Pt asked if her pain medication could be refilled and if she could have something prescribed for the rash. Please advise.

## 2019-01-31 NOTE — Telephone Encounter (Signed)
Patient notified and voices understanding 

## 2019-01-31 NOTE — Telephone Encounter (Signed)
If medication is causing rash, recommend she d/c and schedule visit ASAP.

## 2019-01-31 NOTE — Telephone Encounter (Signed)
Patient stated since she started Tramadol her rash occurred. It is now in more pain and rash is red and burning.Suggested a virtual not able to due to work

## 2019-02-01 ENCOUNTER — Encounter: Payer: Self-pay | Admitting: Family Medicine

## 2019-02-01 ENCOUNTER — Ambulatory Visit: Payer: BC Managed Care – PPO | Admitting: Family Medicine

## 2019-02-01 VITALS — BP 110/74 | HR 82 | Temp 98.7°F | Ht 65.0 in | Wt 136.0 lb

## 2019-02-01 DIAGNOSIS — G4700 Insomnia, unspecified: Secondary | ICD-10-CM | POA: Insufficient documentation

## 2019-02-01 DIAGNOSIS — R2990 Unspecified symptoms and signs involving the nervous system: Secondary | ICD-10-CM

## 2019-02-01 MED ORDER — HYDROXYZINE HCL 50 MG PO TABS: 25.0000 mg | ORAL_TABLET | Freq: Every evening | ORAL | 1 refills | Status: DC | PRN

## 2019-02-01 MED ORDER — TAPENTADOL HCL 50 MG PO TABS: 50.0000 mg | ORAL_TABLET | Freq: Four times a day (QID) | ORAL | 0 refills | Status: DC | PRN

## 2019-02-01 MED ORDER — TRIAMCINOLONE ACETONIDE 0.5 % EX OINT: 1.0000 "application " | TOPICAL_OINTMENT | Freq: Two times a day (BID) | CUTANEOUS | 0 refills | Status: DC

## 2019-02-01 NOTE — Assessment & Plan Note (Addendum)
Stop tramadol because of this does not appear to be consistent with a drug rash.  Will start Nucynta.  Discussed potential side effects.  Unclear if her rash is part of her overall larger presentation.  Depending on results of MRI, and in light of her recent rash, would consider further investigation into underlying vasculitides and possibly mast cell activation syndrome.

## 2019-02-01 NOTE — Patient Instructions (Signed)
It was very nice to see you today!  We will send in 3 medications today.  Please use the triamcinolone, Nucynta, and hydroxyzine.  Stop the tramadol.  We will contact you once your results come back on your MRI.  Take care, Dr Jimmey Ralph  Please try these tips to maintain a healthy lifestyle:   Eat at least 3 REAL meals and 1-2 snacks per day.  Aim for no more than 5 hours between eating.  If you eat breakfast, please do so within one hour of getting up.    Each meal should contain half fruits/vegetables, one quarter protein, and one quarter carbs (no bigger than a computer mouse)   Cut down on sweet beverages. This includes juice, soda, and sweet tea.     Drink at least 1 glass of water with each meal and aim for at least 8 glasses per day   Exercise at least 150 minutes every week.

## 2019-02-01 NOTE — Assessment & Plan Note (Signed)
Start hydroxyzine.  Hopefully this will help some with the pruritus as well.

## 2019-02-01 NOTE — Progress Notes (Signed)
   Terry Schneider is a 29 y.o. female who presents today for an office visit.  Assessment/Plan:  New/Acute Problems: Rash Pain is not consistent with a drug rash.  Possibly folliculitis versus contact dermatitis.  Rash seems to be improving.  Will start topical triamcinolone.  Chronic Problems Addressed Today: Insomnia Start hydroxyzine.  Hopefully this will help some with the pruritus as well.  Multiple neurological symptoms Stop tramadol because of this does not appear to be consistent with a drug rash.  Will start Nucynta.  Discussed potential side effects.  Unclear if her rash is part of her overall larger presentation.  Depending on results of MRI, and in light of her recent rash, would consider further investigation into underlying vasculitides and possibly mast cell activation syndrome.     Subjective:  HPI:  Rash  Patient seen 8 days ago for multiple neurologic symptoms.  She was started on tramadol due to diffuse pain.  Symptoms did not improve and she developed a bilateral axillary erythematous, purulent rash.  She stopped taking the tramadol and her rash is improved over the last few days.  She is also had increasing hot flashes, body aches, and headaches.  She has had persistent left eye pain.  Rash is very itchy.  Occasionally painful.       Objective:  Physical Exam: BP 110/74   Pulse 82   Temp 98.7 F (37.1 C)   Ht 5\' 5"  (1.651 m)   Wt 136 lb (61.7 kg)   SpO2 99%   BMI 22.63 kg/m   Gen: No acute distress, resting comfortably Skin: Erythematous pustular rash in bilateral axilla with a few excoriations noted.      . Katina Degree, MD 02/01/2019 3:36 PM

## 2019-02-07 ENCOUNTER — Telehealth: Payer: Self-pay

## 2019-02-07 NOTE — Telephone Encounter (Signed)
Sonia Hesser KeyDolan Amen - Rx #: [id number] Need help? Call us at [phone] Status Sent to [facility] Nucynta 50MG  tablets Form [employer] Electronic PA Form (NCPDP) Original Claim Info 75 UP TO 7 D/S COVEREDREDUCE D/S OR PA REQ CALL [phone] REQUIRES PRIOR AUTHORIZATION

## 2019-02-18 ENCOUNTER — Ambulatory Visit
Admission: RE | Admit: 2019-02-18 | Discharge: 2019-02-18 | Disposition: A | Discharge status: Home / Self Care | Payer: BC Managed Care – PPO | Source: Ambulatory Visit | Attending: Family Medicine | Admitting: Family Medicine

## 2019-02-18 ENCOUNTER — Other Ambulatory Visit: Payer: Self-pay

## 2019-02-18 DIAGNOSIS — R2990 Unspecified symptoms and signs involving the nervous system: Secondary | ICD-10-CM

## 2019-02-21 NOTE — Progress Notes (Signed)
Please inform patient of the following:  Her MRI is nondiagnostic. She has no signs of MS or other abnormalities. Would like for her to follow up with [place] soon to discuss her symptoms and next steps in management.  Terry Schneider. Jimmey Ralph, MD 02/21/2019 3:44 PM

## 2019-02-24 NOTE — Progress Notes (Signed)
Left detailed message,informed patient to call with any other questions or concerns

## 2019-02-25 ENCOUNTER — Other Ambulatory Visit: Payer: Self-pay

## 2019-02-28 ENCOUNTER — Other Ambulatory Visit: Payer: Self-pay

## 2019-02-28 ENCOUNTER — Ambulatory Visit (INDEPENDENT_AMBULATORY_CARE_PROVIDER_SITE_OTHER): Payer: BC Managed Care – PPO | Admitting: Family Medicine

## 2019-02-28 ENCOUNTER — Encounter: Payer: Self-pay | Admitting: Family Medicine

## 2019-02-28 VITALS — BP 112/80 | HR 81 | Ht 65.0 in | Wt 139.5 lb

## 2019-02-28 DIAGNOSIS — G894 Chronic pain syndrome: Secondary | ICD-10-CM

## 2019-02-28 DIAGNOSIS — F419 Anxiety disorder, unspecified: Secondary | ICD-10-CM | POA: Diagnosis not present

## 2019-02-28 DIAGNOSIS — F329 Major depressive disorder, single episode, unspecified: Secondary | ICD-10-CM

## 2019-02-28 DIAGNOSIS — F32A Depression, unspecified: Secondary | ICD-10-CM

## 2019-02-28 DIAGNOSIS — R2990 Unspecified symptoms and signs involving the nervous system: Secondary | ICD-10-CM | POA: Diagnosis not present

## 2019-02-28 MED ORDER — CITALOPRAM HYDROBROMIDE 20 MG PO TABS: 20.0000 mg | ORAL_TABLET | Freq: Every day | ORAL | 3 refills | Status: DC

## 2019-02-28 MED ORDER — TAPENTADOL HCL ER 100 MG PO TB12: 100.0000 mg | ORAL_TABLET | Freq: Two times a day (BID) | ORAL | 0 refills | Status: DC

## 2019-02-28 NOTE — Progress Notes (Signed)
   Terry Schneider is a 29 y.o. female who presents today for an office visit.  Assessment/Plan:  Chronic Problems Addressed Today: Chronic pain syndrome We will increase Nucynta to 100 mg extended release every 12 hours.  She may take an extra 1 daily as needed.  She will check in with me in a couple of weeks.  Will titrate dose as needed.  May consider oxycodone in the future if this helps her ability to function and perform activities of daily living.  Anxiety and depression Discussed options.  Patient is agreeable to start Celexa 20 mg daily.  Discussed side effects.  She will follow-up in a couple of weeks.  Multiple neurological symptoms Symptoms are still uncontrolled.  No clear underlying diagnosis for multiple neurologic symptoms.  MRI negative for MS or other possible explanation for symptoms.  She would like to focus on symptom management this point.  Would consider referral back to neurology at some point in the future once she is ready for further investigation.     Subjective:  HPI: Patient last seen about a month ago for multiple neurologic symptoms.  She has previously seen neurology there was concern for multiple sclerosis.  Since her last visit she had MRI done of her brain and cervical spine.  Imaging was unrevealing for MS or any other obvious cause for her symptoms.  She is currently taking Nucynta 50 to 150 mg 2-3 times daily as needed.  Medication works reasonably well however is having some drowsiness.  Also feels like the medication wears off after about an hour.  She has had increasingly depressed mood due to her severe pain and inability to find a diagnosis.  Her pain is limiting her ability to perform activities of daily living.  She has had a call out once this year from work due to her pain.         Objective:  Physical Exam: BP 112/80 (BP Location: Left Arm, Patient Position: Sitting, Cuff Size: Normal)   Pulse 81   Ht 5\' 5"  (1.651 m)   Wt 139 lb 8 oz  (63.3 kg)   SpO2 98%   BMI 23.21 kg/m   Gen: No acute distress, resting comfortably CV: Regular rate and rhythm with no murmurs appreciated Pulm: Normal work of breathing, clear to auscultation bilaterally with no crackles, wheezes, or rhonchi Neuro: Grossly normal, moves all extremities Psych: Normal affect and thought content      [name] M. , MD 02/28/2019 8:41 AM

## 2019-02-28 NOTE — Assessment & Plan Note (Signed)
Discussed options.  Patient is agreeable to start Celexa 20 mg daily.  Discussed side effects.  She will follow-up in a couple of weeks.

## 2019-02-28 NOTE — Patient Instructions (Signed)
It was very nice to see you today!  I am sorry we have not found a answer to your symptoms yet.  We will focus on symptom management.  Please increase your Nucynta to 100 to 200 mg twice daily as needed.  I will send a new prescription with extended release formulation.  Please try the Celexa.  Please check in with me in a couple weeks on my chart, or sooner if needed.  Take care, Dr Jimmey Ralph  Please try these tips to maintain a healthy lifestyle:   Eat at least 3 REAL meals and 1-2 snacks per day.  Aim for no more than 5 hours between eating.  If you eat breakfast, please do so within one hour of getting up.    Each meal should contain half fruits/vegetables, one quarter protein, and one quarter carbs (no bigger than a computer mouse)   Cut down on sweet beverages. This includes juice, soda, and sweet tea.     Drink at least 1 glass of water with each meal and aim for at least 8 glasses per day   Exercise at least 150 minutes every week.

## 2019-02-28 NOTE — Assessment & Plan Note (Signed)
We will increase Nucynta to 100 mg extended release every 12 hours.  She may take an extra 1 daily as needed.  She will check in with me in a couple of weeks.  Will titrate dose as needed.  May consider oxycodone in the future if this helps her ability to function and perform activities of daily living.

## 2019-02-28 NOTE — Assessment & Plan Note (Signed)
Symptoms are still uncontrolled.  No clear underlying diagnosis for multiple neurologic symptoms.  MRI negative for MS or other possible explanation for symptoms.  She would like to focus on symptom management this point.  Would consider referral back to neurology at some point in the future once she is ready for further investigation.

## 2019-03-30 ENCOUNTER — Telehealth: Payer: Self-pay | Admitting: Family Medicine

## 2019-03-30 MED ORDER — NUCYNTA 100 MG PO TABS: 100.0000 mg | ORAL_TABLET | Freq: Three times a day (TID) | ORAL | 0 refills | Status: DC | PRN

## 2019-03-30 NOTE — Telephone Encounter (Signed)
Pt called stating her medication is not effective. The non-extended release Nucynta worked well but the extended release did not. Pt is requesting refill on the non-extended release of Nucynta 100 MG. Pt also states she is still in a tremendous amount of pain. Please advise.

## 2019-03-30 NOTE — Telephone Encounter (Signed)
Noted  

## 2019-03-30 NOTE — Telephone Encounter (Signed)
Please advise 

## 2019-04-07 ENCOUNTER — Ambulatory Visit: Payer: BC Managed Care – PPO | Admitting: Family Medicine

## 2019-04-07 NOTE — Telephone Encounter (Signed)
Patient was notified Rx was sent on 03/30/19. She called the pharmacy and there was a issue with insurance notified I will contact pharmacy.

## 2019-04-07 NOTE — Telephone Encounter (Signed)
Patient is returning Khamika's call  

## 2019-04-13 ENCOUNTER — Telehealth: Payer: Self-pay

## 2019-04-13 NOTE — Telephone Encounter (Signed)
lvm regarding flu vaccine. 

## 2019-05-13 ENCOUNTER — Telehealth: Payer: Self-pay

## 2019-05-13 MED ORDER — NUCYNTA 100 MG PO TABS: 100.0000 mg | ORAL_TABLET | Freq: Three times a day (TID) | ORAL | 0 refills | Status: DC | PRN

## 2019-05-13 NOTE — Telephone Encounter (Signed)
Rx sent in. She can take extra half a pill if needed.  Terry Schneider. Jimmey Ralph, MD 05/13/2019 12:55 PM

## 2019-05-13 NOTE — Telephone Encounter (Signed)
Patient notified

## 2019-05-13 NOTE — Telephone Encounter (Signed)
  LAST APPOINTMENT DATE: 04/13/2019   NEXT APPOINTMENT DATE:@Visit  date not found  MEDICATION:Tapentadol HCl (NUCYNTA) 100 MG TABS  PHARMACY: [id number] - [place],  - [address] [facility] [address] Phone:  [phone]  Fax:  [phone]         **Patient would like to have the dosage of medication increase if possible.patient states that she is still in pain**  **Let patient know to contact pharmacy at the end of the day to make sure medication is ready. **  ** Please notify patient to allow 48-72 hours to process**  **Encourage patient to contact the pharmacy for refills or they can request refills through [facility]**  CLINICAL FILLS OUT ALL BELOW:   LAST REFILL:  QTY:  REFILL DATE:    OTHER COMMENTS:    Okay for refill?  Please advise

## 2019-05-13 NOTE — Addendum Note (Signed)
Addended by: Ardith Dark on: 05/13/2019 12:55 PM   Modules accepted: Orders

## 2019-05-13 NOTE — Telephone Encounter (Signed)
Pt requesting Rx refill

## 2019-05-17 ENCOUNTER — Telehealth: Payer: Self-pay

## 2019-05-17 NOTE — Telephone Encounter (Signed)
PA rejected for Nucynta 100MG  tablets   Covermymeds Key: 

## 2019-05-19 ENCOUNTER — Other Ambulatory Visit: Payer: Self-pay

## 2019-05-19 ENCOUNTER — Other Ambulatory Visit: Payer: Self-pay | Admitting: Family Medicine

## 2019-05-19 MED ORDER — NUCYNTA 100 MG PO TABS: 100.0000 mg | ORAL_TABLET | Freq: Three times a day (TID) | ORAL | 0 refills | Status: DC | PRN

## 2019-05-19 MED ORDER — NUCYNTA 100 MG PO TABS: 100.0000 mg | ORAL_TABLET | Freq: Two times a day (BID) | ORAL | 0 refills | Status: DC | PRN

## 2019-05-19 NOTE — Telephone Encounter (Signed)
Spoke with Pharmacy approved # [id number] patient notified

## 2019-05-19 NOTE — Telephone Encounter (Signed)
Patient called in to get a refill for Nucynta 100 mg. We are waiting on PA for this, patients states she will pay out of pocket she has missed work being in so much pain. Spoke with Milinda Hirschfeld to advise patient.

## 2019-05-19 NOTE — Telephone Encounter (Signed)
error 

## 2019-07-01 ENCOUNTER — Telehealth: Payer: Self-pay | Admitting: Family Medicine

## 2019-07-01 NOTE — Telephone Encounter (Signed)
MEDICATION: Nucynta 100 MG  PHARMACY: [facility] [address]  Comments:   **Let patient know to contact pharmacy at the end of the day to make sure medication is ready. **  ** Please notify patient to allow 48-72 hours to process**  **Encourage patient to contact the pharmacy for refills or they can request refills through [facility]**

## 2019-07-01 NOTE — Telephone Encounter (Signed)
LAST APPOINTMENT DATE 03/20/2019   NEXT APPOINTMENT DATE: 07/13/2019   Rx Nucynta 100 MG LAST REFILL:  05/19/2019   QTY: 120

## 2019-07-02 MED ORDER — NUCYNTA 100 MG PO TABS: 100.0000 mg | ORAL_TABLET | Freq: Three times a day (TID) | ORAL | 0 refills | Status: DC | PRN

## 2019-07-02 NOTE — Telephone Encounter (Signed)
I refilled this but she needs to keep visit on 23rd for next refills

## 2019-07-10 ENCOUNTER — Other Ambulatory Visit: Payer: Self-pay | Admitting: Family Medicine

## 2019-07-13 ENCOUNTER — Ambulatory Visit: Payer: BC Managed Care – PPO | Admitting: Family Medicine

## 2019-07-28 ENCOUNTER — Other Ambulatory Visit: Payer: Self-pay

## 2019-07-28 ENCOUNTER — Ambulatory Visit: Payer: BC Managed Care – PPO | Admitting: Family Medicine

## 2019-07-28 ENCOUNTER — Encounter: Payer: Self-pay | Admitting: Family Medicine

## 2019-07-28 VITALS — BP 110/68 | HR 68 | Temp 97.3°F | Wt 147.0 lb

## 2019-07-28 DIAGNOSIS — R319 Hematuria, unspecified: Secondary | ICD-10-CM | POA: Diagnosis not present

## 2019-07-28 DIAGNOSIS — F329 Major depressive disorder, single episode, unspecified: Secondary | ICD-10-CM

## 2019-07-28 DIAGNOSIS — G4700 Insomnia, unspecified: Secondary | ICD-10-CM

## 2019-07-28 DIAGNOSIS — M722 Plantar fascial fibromatosis: Secondary | ICD-10-CM

## 2019-07-28 DIAGNOSIS — F32A Depression, unspecified: Secondary | ICD-10-CM

## 2019-07-28 DIAGNOSIS — R2990 Unspecified symptoms and signs involving the nervous system: Secondary | ICD-10-CM

## 2019-07-28 DIAGNOSIS — G894 Chronic pain syndrome: Secondary | ICD-10-CM | POA: Diagnosis not present

## 2019-07-28 DIAGNOSIS — F419 Anxiety disorder, unspecified: Secondary | ICD-10-CM

## 2019-07-28 LAB — URINALYSIS, ROUTINE W REFLEX MICROSCOPIC
Bilirubin Urine: NEGATIVE
Hgb urine dipstick: NEGATIVE
Ketones, ur: NEGATIVE
Leukocytes,Ua: NEGATIVE
Nitrite: NEGATIVE
Specific Gravity, Urine: >=1.03 — AB (ref 1.000–1.030)
Total Protein, Urine: NEGATIVE
Urine Glucose: NEGATIVE
Urobilinogen, UA: 0.2 (ref 0.0–1.0)
pH: 6 (ref 5.0–8.0)

## 2019-07-28 MED ORDER — DICLOFENAC SODIUM 75 MG PO TBEC: 75.0000 mg | DELAYED_RELEASE_TABLET | Freq: Two times a day (BID) | ORAL | 0 refills | Status: DC

## 2019-07-28 MED ORDER — OXYCODONE HCL 5 MG PO TABS: 5.0000 mg | ORAL_TABLET | Freq: Three times a day (TID) | ORAL | 0 refills | Status: DC

## 2019-07-28 NOTE — Patient Instructions (Signed)
It was very nice to see you today!  We will send in a couple of new medications.  Please try that for a couple of weeks and send me a MyChart message in a couple weeks to let me know how it is working for you.  You can stop the Nucynta.    We will check a urine sample today.  I think you may have plantar fasciitis.  Please work on the exercises and take the medications as above.  Take care, Dr Jimmey Ralph  Please try these tips to maintain a healthy lifestyle:   Eat at least 3 REAL meals and 1-2 snacks per day.  Aim for no more than 5 hours between eating.  If you eat breakfast, please do so within one hour of getting up.    Each meal should contain half fruits/vegetables, one quarter protein, and one quarter carbs (no bigger than a computer mouse)   Cut down on sweet beverages. This includes juice, soda, and sweet tea.     Drink at least 1 glass of water with each meal and aim for at least 8 glasses per day   Exercise at least 150 minutes every week.
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
## 2019-07-28 NOTE — Assessment & Plan Note (Signed)
Still no clear etiology and symptoms are still uncontrolled.  Will focus on symptom management as above.  Will need to follow-up with neurology at some point in the future if symptoms continue to worsen.

## 2019-07-28 NOTE — Assessment & Plan Note (Signed)
Has not tolerated several medications.  Is no longer tolerating hydroxyzine.  Cough will have some improvement as we treat her pain above.

## 2019-07-28 NOTE — Assessment & Plan Note (Signed)
Had extensive discussion with patient regarding treatment plan.  She has not responded to the center.  Will start low-dose oxycodone.  If this works well for her would likely need referral to pain management clinic.  If continues to have issues with pain management will also likely need referral to pain management clinic as there are not many other options for [place] to consider at this point.  She has tried several things in the pastWithout significant improvement including meloxicam, prednisone, trazodone, amitriptyline, Cymbalta, gabapentin, Lyrica.

## 2019-07-28 NOTE — Assessment & Plan Note (Signed)
Overall stable.  Advised her to follow-up with her therapist soon.

## 2019-07-29 NOTE — Progress Notes (Signed)
Please inform patient of the following:  She is dehydrated but she has no signs of blood or infection in her urine.  She did have a few of small crystals which could possibly indicate kidney stones -this could have been what caused her bloody urine a few weeks ago.  Now that her symptoms are resolved and the blood in her  urine resolved, we do not need to do any further testing at this point.  Would like for her to let us know if symptoms come back.

## 2019-08-16 ENCOUNTER — Encounter: Payer: Self-pay | Admitting: Family Medicine

## 2019-08-22 ENCOUNTER — Encounter: Payer: Self-pay | Admitting: Family Medicine

## 2019-08-23 ENCOUNTER — Telehealth: Payer: Self-pay | Admitting: Family Medicine

## 2019-08-23 MED ORDER — NUCYNTA 100 MG PO TABS: 1.0000 | ORAL_TABLET | Freq: Four times a day (QID) | ORAL | 0 refills | Status: DC | PRN

## 2019-08-23 NOTE — Telephone Encounter (Signed)
Would like for her to schedule OV if symptoms persist.  Katina Degree. Jimmey Ralph, MD 08/23/2019 1:14 PM

## 2019-08-23 NOTE — Telephone Encounter (Signed)
Nurse Assessment Nurse: Fransisco Hertz, RN, Elnita Maxwell Date/Time Lamount Cohen Time): 08/23/2019 12:15:35 PM Confirm and document reason for call. If symptomatic, describe symptoms. ---Caller states that she has been seeing Dr. Jimmey Ralph regarding pain all over. She is now experiencing orange/red urine. She states that she believes she passed a kidney stone. She states that she is unable to urinate at times and when she does she is passing particles. She is drinking a lot of fluids. No fever. Has the patient had close contact with a person known or suspected to have the novel coronavirus illness OR traveled / lives in area with major community spread (including international travel) in the last 14 days from the onset of symptoms? * If Asymptomatic, screen for exposure and travel within the last 14 days. ---No Does the patient have any new or worsening symptoms? ---Yes Will a triage be completed? ---Yes Related visit to physician within the last 2 weeks? ---Yes Does the PT have any chronic conditions? (i.e. diabetes, asthma, this includes High risk factors for pregnancy, etc.) ---No Is the patient pregnant or possibly pregnant? (Ask all females between the ages of 26-55) ---No Is this a behavioral health or substance abuse call? ---NoPLEASE NOTE: All timestamps contained within this report are represented as [place] Standard Time. CONFIDENTIALTY NOTICE: This fax transmission is intended only for the addressee. It contains information that is legally privileged, confidential or otherwise protected from use or disclosure. If you are not the intended recipient, you are strictly prohibited from reviewing, disclosing, copying using or disseminating any of this information or taking any action in reliance on or regarding this information. If you have received this fax in error, please notify us immediately by telephone so that we can arrange for its return to [place]. Phone: [phone], Toll-Free: [phone], Fax:  [phone] Page: 2 of 2 Call Id: [id number] Guidelines Guideline Title Affirmed Question Affirmed Notes Nurse Date/Time Lamount Cohen Time) Urine - Blood In [1] Pain or burning with passing urine AND [2] side (flank) or back pain present Wendie Chess 08/23/2019 12:19:07 PM Disp. Time Lamount Cohen Time) Disposition Final User 08/23/2019 12:13:21 PM Send to Urgent Queue Cherylynn Ridges 08/23/2019 12:22:11 PM See HCP within 4 Hours (or PCP triage) Yes Fransisco Hertz, RN, Elizabeth Sauer Disagree/Comply Comply Caller Understands Yes PreDisposition Did not know what to do Care Advice Given Per Guideline SEE HCP WITHIN 4 HOURS (OR PCP TRIAGE): CALL BACK IF: * You become worse. * Fever occurs Comments User: Caryn Bee, RN Date/Time Lamount Cohen Time): 08/23/2019 12:27:31 PM Office has no appts and recommends UC Referrals GO TO FACILITY UNDECIDED

## 2019-08-23 NOTE — Telephone Encounter (Signed)
Patient called in this afternoon, to advise Dr.Parker that when she urinates it has been orange/red, thinks that she may have possibly passed a kidney stone, has been drinking plenty of water but has a constant salty taste in mouth. Did have patient speak to team health.

## 2019-08-23 NOTE — Telephone Encounter (Signed)
Patient has been notified via mychart to follow up in office if the problem persist.

## 2019-08-25 ENCOUNTER — Ambulatory Visit: Payer: BC Managed Care – PPO | Admitting: Family Medicine

## 2019-09-05 ENCOUNTER — Other Ambulatory Visit: Payer: Self-pay

## 2019-09-05 ENCOUNTER — Encounter: Payer: Self-pay | Admitting: Family Medicine

## 2019-09-05 DIAGNOSIS — G894 Chronic pain syndrome: Secondary | ICD-10-CM

## 2019-09-05 DIAGNOSIS — R2990 Unspecified symptoms and signs involving the nervous system: Secondary | ICD-10-CM

## 2019-09-08 ENCOUNTER — Encounter: Payer: Self-pay | Admitting: Family Medicine

## 2019-11-09 ENCOUNTER — Other Ambulatory Visit: Payer: Self-pay | Admitting: Family Medicine

## 2019-11-10 MED ORDER — NUCYNTA 100 MG PO TABS: 1.0000 | ORAL_TABLET | Freq: Four times a day (QID) | ORAL | 0 refills | Status: AC | PRN

## 2020-01-02 ENCOUNTER — Telehealth: Payer: Self-pay

## 2020-01-02 NOTE — Telephone Encounter (Signed)
Terry Schneider from from [employer] office called she is requesting a appointment for the patient she stated the patient needs a nerve conduction study/emg for her upper and lower extremities she is requesting a appointment before the end of the year. CB:[phone]

## 2020-01-02 NOTE — Telephone Encounter (Signed)
Please advise 

## 2020-01-03 NOTE — Telephone Encounter (Signed)
Called Dr. Geoffery Lyons office to advise.

## 2020-01-03 NOTE — Telephone Encounter (Signed)
Please notify Dr. Duaine Dredge Office  that unfortunately 1) I cannot do 3 to 4 limb studies in the office here and this needs to go [facility] [place] etc 2) EMGs that we do are booked past New Years

## 2020-01-04 ENCOUNTER — Other Ambulatory Visit: Payer: Self-pay | Admitting: Family Medicine

## 2020-01-04 DIAGNOSIS — M79661 Pain in right lower leg: Secondary | ICD-10-CM

## 2020-01-04 DIAGNOSIS — M5416 Radiculopathy, lumbar region: Secondary | ICD-10-CM

## 2020-01-07 ENCOUNTER — Ambulatory Visit
Admission: RE | Admit: 2020-01-07 | Discharge: 2020-01-07 | Disposition: A | Discharge status: Home / Self Care | Payer: BC Managed Care – PPO | Source: Ambulatory Visit | Attending: Family Medicine | Admitting: Family Medicine

## 2020-01-07 DIAGNOSIS — M79662 Pain in left lower leg: Secondary | ICD-10-CM

## 2020-01-07 DIAGNOSIS — M79661 Pain in right lower leg: Secondary | ICD-10-CM

## 2020-01-07 DIAGNOSIS — M5416 Radiculopathy, lumbar region: Secondary | ICD-10-CM

## 2020-01-30 ENCOUNTER — Other Ambulatory Visit: Payer: BC Managed Care – PPO

## 2020-03-01 ENCOUNTER — Encounter: Payer: Self-pay | Admitting: *Deleted
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
## 2020-03-05 ENCOUNTER — Ambulatory Visit: Payer: BC Managed Care – PPO | Admitting: Neurology

## 2020-03-05 ENCOUNTER — Encounter: Payer: Self-pay | Admitting: Neurology

## 2020-03-05 VITALS — BP 108/73 | HR 74 | Ht 65.0 in | Wt 136.0 lb

## 2020-03-05 DIAGNOSIS — R200 Anesthesia of skin: Secondary | ICD-10-CM

## 2020-03-05 DIAGNOSIS — R202 Paresthesia of skin: Secondary | ICD-10-CM

## 2020-03-05 NOTE — Patient Instructions (Addendum)
Right arm and right leg EMG/NCS    Common Peroneal Nerve Entrapment  Common peroneal nerve entrapment is a condition that can make it hard to lift a foot. The condition results from pressure on a nerve in the lower leg called the common peroneal nerve. Your common peroneal nerve provides feeling to your outer lower leg and foot. It also supplies the muscles that move your foot and toes upward and outward. What are the causes? This condition may be caused by:  Sitting cross-legged, squatting, or kneeling for long periods of time.  A hard, direct hit to the side of the lower leg.  Swelling from a knee injury.  A break (fracture) in one of the lower leg bones.  Wearing a boot or cast that ends just below the knee.  A growth or cyst near the nerve. What increases the risk? This condition is more likely to develop in people who play:  Contact sports, such as football or hockey.  Sports where you wear high and stiff boots, such as skiing. What are the signs or symptoms? Symptoms of this condition include:  Trouble lifting your foot up (foot drop).  Tripping often.  Your foot hitting the ground harder than normal as you walk.  Numbness, tingling, or pain in the outside of the knee, outside of the lower leg, and top of the foot.  Sensitivity to pressure on the front or side of the leg. How is this diagnosed? This condition may be diagnosed based on:  Your symptoms.  Your medical history.  A physical exam.  Tests, such as: ? An X-ray to check the bones of your knee and leg. ? MRI to check tendons that attach to the side of your knee. ? An ultrasound to check for a growth or cyst. ? An electromyogram (EMG) to check your nerves. During your physical exam, your health care provider will check for numbness in your leg and test the strength of your lower leg muscles. He or she may tap the side of your lower leg to see if that causes tingling. How is this treated? Treatment  for this condition may include:  Avoiding activities that make symptoms worse.  Using a brace to hold up your foot and toes.  Taking anti-inflammatory pain medicines to relieve swelling and lessen pain.  Having medicines injected into your ankle joint to lessen pain and swelling.  Doing exercises to help you regain or maintain movement (physical therapy).  Surgery to take pressure off the nerve. This may be needed if there is no improvement after 2-3 months or if there is a growth pushing on the nerve.  Returning gradually to full activity. Follow these instructions at home: If you have a brace:  Wear it as told by your health care provider. Remove it only as told by your health care provider.  Loosen the brace if your toes tingle, become numb, or turn cold and blue.  Keep the brace clean.  If the brace is not waterproof: ? Do not let it get wet. ? Cover it with a watertight covering when you take a bath or a shower.  Ask your health care provider when it is safe to drive with a brace on your foot. Activity  Return to your normal activities as told by your health care provider. Ask your health care provider what activities are safe for you.  Do not do any activities that make pain or swelling worse.  Do exercises as told by your health care  provider. General instructions  Take over-the-counter and prescription medicines only as told by your health care provider.  Do not put your full weight on your knee until your health care provider says you can. Use crutches as directed by your health care provider.  Keep all follow-up visits as told by your health care provider. This is important. How is this prevented?  Wear supportive footwear that is appropriate for your athletic activity.  Avoid athletic activities that cause ankle pain or swelling.  Wear protective padding over your lower legs when playing contact sports.  Make sure your boots do not put extra pressure on  the area just below your knees.  Do not sit cross-legged for long periods of time. Contact a health care provider if:  Your symptoms do not get better in 2-3 months.  The weakness or numbness in your leg or foot gets worse. Summary  Common peroneal nerve entrapment is a condition that results from pressure on a nerve in the lower leg called the common peroneal nerve.  This condition may be caused by a hard hit, swelling, a fracture, or a cyst in the lower leg.  Treatment may include rest, a brace, medicines, and physical therapy. Sometimes surgery is needed.  Do not do any activities that make pain or swelling worse. This information is not intended to replace advice given to you by your health care provider. Make sure you discuss any questions you have with your health care provider. Document Revised: 11/16/2017 Document Reviewed: 11/16/2017 Elsevier Patient Education  2021 [employer]  Electromyoneurogram Electromyoneurogram is a test to check how well your muscles and nerves are working. This procedure includes the combined use of electromyogram (EMG) and nerve conduction study (NCS). EMG is used to look for muscular disorders. NCS, which is also called electroneurogram, measures how well your nerves are controlling your muscles. The procedures are usually done together to check if your muscles and nerves are healthy. If the results of the tests are abnormal, this may indicate disease or injury, such as a neuromuscular disease or peripheral nerve damage. Tell a health care provider about:  Any allergies you have.  All medicines you are taking, including vitamins, herbs, eye drops, creams, and over-the-counter medicines.  Any problems you or family members have had with anesthetic medicines.  Any blood disorders you have.  Any surgeries you have had.  Any medical conditions you have.  If you have a pacemaker.  Whether you are pregnant or may be pregnant. What are the  risks? Generally, this is a safe procedure. However, problems may occur, including:  Infection where the electrodes were inserted.  Bleeding. What happens before the procedure? Medicines Ask your health care provider about:  Changing or stopping your regular medicines. This is especially important if you are taking diabetes medicines or blood thinners.  Taking medicines such as aspirin and ibuprofen. These medicines can thin your blood. Do not take these medicines unless your health care provider tells you to take them.  Taking over-the-counter medicines, vitamins, herbs, and supplements. General instructions  Your health care provider may ask you to avoid: ? Beverages that have caffeine, such as coffee and tea. ? Any products that contain nicotine or tobacco. These products include cigarettes, e-cigarettes, and chewing tobacco. If you need help quitting, ask your health care provider.  Do not use lotions or creams on the same day that you will be having the procedure. What happens during the procedure? For EMG  Your health care provider  will ask you to stay in a position so that he or she can access the muscle that will be studied. You may be standing, sitting, or lying down.  You may be given a medicine that numbs the area (local anesthetic).  A very thin needle that has an electrode will be inserted into your muscle.  Another small electrode will be placed on your skin near the muscle.  Your health care provider will ask you to continue to remain still.  The electrodes will send a signal that tells about the electrical activity of your muscles. You may see this on a monitor or hear it in the room.  After your muscles have been studied at rest, your health care provider will ask you to contract or flex your muscles. The electrodes will send a signal that tells about the electrical activity of your muscles.  Your health care provider will remove the electrodes and the  electrode needles when the procedure is finished. The procedure may vary among health care providers and hospitals.   For NCS  An electrode that records your nerve activity (recording electrode) will be placed on your skin by the muscle that is being studied.  An electrode that is used as a reference (reference electrode) will be placed near the recording electrode.  A paste or gel will be applied to your skin between the recording electrode and the reference electrode.  Your nerve will be stimulated with a mild shock. Your health care provider will measure how much time it takes for your muscle to react.  Your health care provider will remove the electrodes and the gel when the procedure is finished. The procedure may vary among health care providers and hospitals.   What happens after the procedure?  It is up to you to get the results of your procedure. Ask your health care provider, or the department that is doing the procedure, when your results will be ready.  Your health care provider may: ? Give you medicines for any pain. ? Monitor the insertion sites to make sure that bleeding stops. Summary  Electromyoneurogram is a test to check how well your muscles and nerves are working.  If the results of the tests are abnormal, this may indicate disease or injury.  This is a safe procedure. However, problems may occur, such as bleeding and infection.  Your health care provider will do two tests to complete this procedure. One checks your muscles (EMG) and another checks your nerves (NCS).  It is up to you to get the results of your procedure. Ask your health care provider, or the department that is doing the procedure, when your results will be ready. This information is not intended to replace advice given to you by your health care provider. Make sure you discuss any questions you have with your health care provider. Document Revised: 09/22/2017 Document Reviewed:  09/04/2017 Elsevier Patient Education  2021 [employer] Inc.    Carpal Tunnel Syndrome  Carpal tunnel syndrome is a condition that causes pain, weakness, and numbness in your hand and arm. Numbness is when you cannot feel an area in your body. The carpal tunnel is a narrow area that is on the palm side of your wrist. Repeated wrist motion or certain diseases may cause swelling in the tunnel. This swelling can pinch the main nerve in the wrist. This nerve is called the median nerve. What are the causes? This condition may be caused by:  Moving your hand and wrist over  and over again while doing a task.  Injury to the wrist.  Arthritis.  A sac of fluid (cyst) or abnormal growth (tumor) in the carpal tunnel.  Fluid buildup during pregnancy.  Use of tools that vibrate. Sometimes the cause is not known. What increases the risk? The following factors may make you more likely to have this condition:  Having a job that makes you do these things: ? Move your hand over and over again. ? Work with tools that vibrate, such as drills or sanders.  Being a woman.  Having diabetes, obesity, thyroid problems, or kidney failure. What are the signs or symptoms? Symptoms of this condition include:  A tingling feeling in your fingers.  Tingling or loss of feeling in your hand.  Pain in your entire arm. This pain may get worse when you bend your wrist and elbow for a long time.  Pain in your wrist that goes up your arm to your shoulder.  Pain that goes down into your palm or fingers.  Weakness in your hands. You may find it hard to grab and hold items. You may feel worse at night. How is this treated? This condition may be treated with:  Lifestyle changes. You will be asked to stop or change the activity that caused your problem.  Doing exercises and activities that make bones, muscles, and tendons stronger (physical therapy).  Learning how to use your hand again (occupational  therapy).  Medicines for pain and swelling. You may have injections in your wrist.  A wrist splint or brace.  Surgery. Follow these instructions at home: If you have a splint or brace:  Wear the splint or brace as told by your doctor. Take it off only as told by your doctor.  Loosen the splint if your fingers: ? Tingle. ? Become numb. ? Turn cold and blue.  Keep the splint or brace clean.  If the splint or brace is not waterproof: ? Do not let it get wet. ? Cover it with a watertight covering when you take a bath or a shower. Managing pain, stiffness, and swelling If told, put ice on the painful area:  If you have a removable splint or brace, remove it as told by your doctor.  Put ice in a plastic bag.  Place a towel between your skin and the bag.  Leave the ice on for 20 minutes, 2-3 times per day. Do not fall asleep with the cold pack on your skin.  Take off the ice if your skin turns bright red. This is very important. If you cannot feel pain, heat, or cold, you have a greater risk of damage to the area. Move your fingers often to reduce stiffness and swelling.   General instructions  Take over-the-counter and prescription medicines only as told by your doctor.  Rest your wrist from any activity that may cause pain. If needed, talk with your boss at work about changes that can help your wrist heal.  Do exercises as told by your doctor, physical therapist, or occupational therapist.  Keep all follow-up visits. Contact a doctor if:  You have new symptoms.  Medicine does not help your pain.  Your symptoms get worse. Get help right away if:  You have very bad numbness or tingling in your wrist or hand. Summary  Carpal tunnel syndrome is a condition that causes pain in your hand and arm.  It is often caused by repeated wrist motions.  Lifestyle changes and medicines are used to  treat this problem. Surgery may help in very bad cases.  Follow your doctor's  instructions about wearing a splint, resting your wrist, keeping follow-up visits, and calling for help. This information is not intended to replace advice given to you by your health care provider. Make sure you discuss any questions you have with your health care provider. Document Revised: 05/19/2019 Document Reviewed: 05/19/2019 Elsevier Patient Education  2021 [employer] Inc.  Cubital Tunnel Syndrome  Cubital tunnel syndrome is a condition that causes pain and weakness of the forearm and hand. It happens when one of the nerves that runs along the inside of the elbow joint (ulnar nerve) becomes irritated. This condition is usually caused by repeated arm motions that are done during sports or work-related activities. What are the causes? This condition may be caused by:  Increased pressure on the ulnar nerve at the elbow, arm, or forearm. This can result from: ? Irritation caused by repeated elbow bending. ? Poorly healed elbow fractures. ? Tumors in the elbow. These are usually noncancerous (benign). ? Scar tissue that develops in the elbow after an injury. ? Bony growths (spurs) near the ulnar nerve.  Stretching of the nerve due to loose elbow ligaments.  Trauma to the nerve at the elbow. What increases the risk? The following factors may make you more likely to develop this condition:  Doing manual labor that requires frequent bending of the elbow.  Playing sports that include repeated or strenuous throwing motions, such as baseball.  Playing contact sports, such as football or lacrosse.  Not warming up properly before activities.  Having diabetes.  Having an underactive thyroid (hypothyroidism). What are the signs or symptoms? Symptoms of this condition include:  Clumsiness or weakness of the hand.  Tenderness of the inner elbow.  Aching or soreness of the inner elbow, forearm, or fingers, especially the little finger or the ring finger.  Increased pain when forcing  the elbow to bend.  Reduced control when throwing objects.  Tingling, numbness, or a burning feeling inside the forearm or in part of the hand or fingers, especially the little finger or the ring finger.  Sharp pains that shoot from the elbow down to the wrist and hand.  The inability to grip or pinch hard. How is this diagnosed? This condition is diagnosed based on:  Your symptoms and medical history. Your health care provider will also ask for details about any injury.  A physical exam. You may also have tests, including:  Electromyogram (EMG). This test measures electrical signals sent by your nerves into the muscles.  Nerve conduction study. This test measures how well electrical signals pass through your nerves.  Imaging tests, such as X-rays, ultrasound, and MRI. These tests check for possible causes of your condition. How is this treated? This condition may be treated by:  Stopping the activities that are causing your symptoms to get worse.  Icing and taking medicines to reduce pain and swelling.  Wearing a splint to prevent your elbow from bending, or wearing an elbow pad where the ulnar nerve is closest to the skin.  Working with a physical therapist in less severe cases. This may help to: ? Decrease your symptoms. ? Improve the strength and range of motion of your elbow, forearm, and hand. If these treatments do not help, surgery may be needed. Follow these instructions at home: If you have a splint:  Wear the splint as told by your health care provider. Remove it only as told by your  health care provider.  Loosen the splint if your fingers tingle, become numb, or turn cold and blue.  Keep the splint clean.  If the splint is not waterproof: ? Do not let it get wet. ? Cover it with a watertight covering when you take a bath or shower. Managing pain, stiffness, and swelling  If directed, put ice on the injured area: ? Put ice in a plastic bag. ? Place a  towel between your skin and the bag. ? Leave the ice on for 20 minutes, 2-3 times a day.  Move your fingers often to avoid stiffness and to lessen swelling.  Raise (elevate) the injured area above the level of your heart while you are sitting or lying down.   General instructions  Take over-the-counter and prescription medicines only as told by your health care provider.  Do any exercise or physical therapy as told by your health care provider.  Do not drive or use heavy machinery while taking prescription pain medicine.  If you were given an elbow pad, wear it as told by your health care provider.  Keep all follow-up visits as told by your health care provider. This is important. Contact a health care provider if:  Your symptoms get worse.  Your symptoms do not get better with treatment.  You have new pain.  Your hand on the injured side feels numb or cold. Summary  Cubital tunnel syndrome is a condition that causes pain and weakness of the forearm and hand.  You are more likely to develop this condition if you do work or play sports that involve repeated arm movements.  This condition is often treated by stopping repetitive activities, applying ice, and using anti-inflammatory medicines.  In rare cases, surgery may be needed. This information is not intended to replace advice given to you by your health care provider. Make sure you discuss any questions you have with your health care provider. Document Revised: 05/25/2017 Document Reviewed: 05/25/2017 Elsevier Patient Education  2021 [employer].

## 2020-04-09 ENCOUNTER — Ambulatory Visit (INDEPENDENT_AMBULATORY_CARE_PROVIDER_SITE_OTHER): Payer: BC Managed Care – PPO | Admitting: Neurology

## 2020-04-09 ENCOUNTER — Ambulatory Visit: Payer: BC Managed Care – PPO | Admitting: Neurology

## 2020-04-09 DIAGNOSIS — R202 Paresthesia of skin: Secondary | ICD-10-CM | POA: Diagnosis not present

## 2020-04-09 DIAGNOSIS — R200 Anesthesia of skin: Secondary | ICD-10-CM

## 2020-04-09 DIAGNOSIS — Z0289 Encounter for other administrative examinations: Secondary | ICD-10-CM

## 2020-04-09 NOTE — Progress Notes (Signed)
Paresthesias in the arms and legs.I had a discussion about normal findings on emg/ncs. We will perform a repeat neuropathy panel but she may want to discuss going to an academic center with Dr. Sandi Mariscal. MRI brain, cervical spine, lumbar spine have all been essentially normal.  Orders Placed This Encounter  Procedures  . B12 and Folate Panel  . Methylmalonic acid, serum  . TSH  . HIV Antibody (routine testing w rflx)  . Sedimentation rate  . Sjogren's syndrome antibods(ssa + ssb)  . Angiotensin converting enzyme  . Pan-ANCA  . RPR  . Hepatitis C antibody  . Rheumatoid factor  . Heavy metals, blood  . Multiple Myeloma Panel (SPEP&IFE w/QIG)  . Vitamin B6  . Hemoglobin A1c  . ANA, IFA (with reflex)  . Vitamin B1  . Copper, serum   I spent 20 minutes of face-to-face and non-face-to-face time with patient on the  1. Paresthesias   2. Numbness and tingling    diagnosis.  This included previsit chart review, lab review, study review, order entry, electronic health record documentation, patient education on the different diagnostic and therapeutic options, counseling and coordination of care, risks and benefits of management, compliance, or risk factor reduction. This does not include time spent on emg/ncs.

## 2020-04-10 NOTE — Procedures (Signed)
° ° ° °   °Full Name: Terry Schneider Gender: Female °MRN #: [id number] Date of Birth: 09/21/1990 °   °Visit Date: 04/09/2020 15:30 °Age: 29 Years °Examining Physician: Telesia Ates, MD  °Referring Physician: Peter Blomgren, MD °   °History: Paresthesias in the arms and legs °Summary: EMG/NCS performed on the right upper and right lower extremities. All nerves and muscles (as indicated in the following tables) were within normal limits.   °Conclusion: This is a normal study ° °------------------------------- °Terry Schneider M.D. ° °[facility] °[address], [address] °[place], [place] [address] °Tel: [phone] °Fax: [phone] ° °Verbal informed consent was obtained from the patient, patient was informed of potential risk of procedure, including bruising, bleeding, hematoma formation, infection, muscle weakness, muscle pain, numbness, among others. °   ° °   °[facility] °   °Nerve / Sites Muscle Latency Ref. Amplitude Ref. Rel Amp Segments Distance Velocity Ref. Area  °  ms ms mV mV %  cm m/s m/s mVms  °R Median - APB  °   Wrist APB 3.4 ?4.4 9.3 ?4.0 100 Wrist - APB 7   34.8  °   Upper arm APB 7.1  9.1  97.7 Upper arm - Wrist 22 58 ?49 34.2  °R Ulnar - ADM  °   Wrist ADM 2.8 ?3.3 10.1 ?6.0 100 Wrist - ADM 7   32.8  °   B.Elbow ADM 5.9  9.7  96.3 B.Elbow - Wrist 18 59 ?49 30.8  °   A.Elbow ADM 7.8  9.6  98.3 A.Elbow - B.Elbow 10 53 ?49 30.1  °R Peroneal - EDB  °   Ankle EDB 3.6 ?6.5 9.0 ?2.0 100 Ankle - EDB 9   30.5  °   Fib head EDB 8.5  7.8  86.7 Fib head - Ankle 27 55 ?44 29.4  °   Pop fossa EDB 10.4  7.7  98.3 Pop fossa - Fib head 10 52 ?44 29.9  °       Pop fossa - Ankle      °R Tibial - AH  °   Ankle AH 2.9 ?5.8 20.1 ?4.0 100 Ankle - AH 9   41.3  °   Pop fossa AH 11.0  15.6  77.7 Pop fossa - Ankle 39 48 ?41 35.9  °           °SNC °   °Nerve / Sites Rec. Site Peak Lat Ref.  Amp Ref. Segments Distance Peak Diff Ref.  °  ms ms µV µV  cm ms ms  °R Sural - Ankle (Calf)  °   Calf Ankle 2.5 ?4.4 24 ?6 Calf  - Ankle 14    °R Superficial peroneal - Ankle  °   Lat leg Ankle 3.4 ?4.4 8 ?6 Lat leg - Ankle 14    °R Median, Ulnar - Transcarpal comparison  °   Median Palm Wrist 2.1 ?2.2 125 ?35 Median Palm - Wrist 8    °   Ulnar Palm Wrist 2.0 ?2.2 43 ?12 Ulnar Palm - Wrist 8    °      Median Palm - Ulnar Palm  0.1 ?0.4  °R Median - Orthodromic (Dig II, Mid palm)  °   Dig II Wrist 3.1 ?3.4 29 ?10 Dig II - Wrist 13    °R Ulnar - Orthodromic, (Dig V, Mid palm)  °   Dig V Wrist 2.6 ?3.1 16 ?5 Dig V - Wrist 11    °             °  F  Wave °   °Nerve F Lat Ref.  ° ms ms  °R Tibial - AH 42.4 ?56.0  °R Ulnar - ADM 25.6 ?32.0  °       °EMG Summary Table   ° Spontaneous MUAP Recruitment  °Muscle IA Fib PSW Fasc Other Amp Dur. Poly Pattern  °R. Deltoid Normal None None None _______ Normal Normal Normal Normal  °R. Triceps brachii Normal None None None _______ Normal Normal Normal Normal  °R. Pronator teres Normal None None None _______ Normal Normal Normal Normal  °R. First dorsal interosseous Normal None None None _______ Normal Normal Normal Normal  °R. Opponens pollicis Normal None None None _______ Normal Normal Normal Normal  °R. Vastus medialis Normal None None None _______ Normal Normal Normal Normal  °R. Tibialis anterior Normal None None None _______ Normal Normal Normal Normal  °R. Gluteus medius Normal None None None _______ Normal Normal Normal Normal  °R. Gluteus maximus Normal None None None _______ Normal Normal Normal Normal  °R. Gastrocnemius (Medial head) Normal None None None _______ Normal Normal Normal Normal  °R. Biceps femoris (long head) Normal None None None _______ Normal Normal Normal Normal  °R. Cervical paraspinals (low) Normal None None None _______ Normal Normal Normal Normal  °R. Lumbar paraspinals (low) Normal None None None _______ Normal Normal Normal Normal  ° °  ° °

## 2020-04-10 NOTE — Progress Notes (Signed)
See procedure notes.

## 2020-04-10 NOTE — Progress Notes (Signed)
Full Name: Terry Schneider Gender: Female MRN #: [id number] Date of Birth: [birth year]    Visit Date: 04/09/2020 15:30 Age: 30 Years Examining Physician: Naomie Dean, MD  Referring Physician: Mosetta Putt, MD    History: Paresthesias in the arms and legs Summary: EMG/NCS performed on the right upper and right lower extremities. All nerves and muscles (as indicated in the following tables) were within normal limits.   Conclusion: This is a normal study  ------------------------------- Naomie Dean M.D.  [facility] [address], [address] [place], [place] [address] Tel: [phone] Fax: [phone]  Verbal informed consent was obtained from the patient, patient was informed of potential risk of procedure, including bruising, bleeding, hematoma formation, infection, muscle weakness, muscle pain, numbness, among others.        MNC    Nerve / Sites Muscle Latency Ref. Amplitude Ref. Rel Amp Segments Distance Velocity Ref. Area    ms ms mV mV %  cm m/s m/s mVms  R Median - APB     Wrist APB 3.4 ?4.4 9.3 ?4.0 100 Wrist - APB 7   34.8     Upper arm APB 7.1  9.1  97.7 Upper arm - Wrist 22 58 ?49 34.2  R Ulnar - ADM     Wrist ADM 2.8 ?3.3 10.1 ?6.0 100 Wrist - ADM 7   32.8     B.Elbow ADM 5.9  9.7  96.3 B.Elbow - Wrist 18 59 ?49 30.8     A.Elbow ADM 7.8  9.6  98.3 A.Elbow - B.Elbow 10 53 ?49 30.1  R Peroneal - EDB     Ankle EDB 3.6 ?6.5 9.0 ?2.0 100 Ankle - EDB 9   30.5     Fib head EDB 8.5  7.8  86.7 Fib head - Ankle 27 55 ?44 29.4     Pop fossa EDB 10.4  7.7  98.3 Pop fossa - Fib head 10 52 ?44 29.9         Pop fossa - Ankle      R Tibial - AH     Ankle AH 2.9 ?5.8 20.1 ?4.0 100 Ankle - AH 9   41.3     Pop fossa AH 11.0  15.6  77.7 Pop fossa - Ankle 39 48 ?41 35.9             SNC    Nerve / Sites Rec. Site Peak Lat Ref.  Amp Ref. Segments Distance Peak Diff Ref.    ms ms V V  cm ms ms  R Sural - Ankle (Calf)     Calf Ankle 2.5 ?4.4 24 ?6 Calf  - Ankle 14    R Superficial peroneal - Ankle     Lat leg Ankle 3.4 ?4.4 8 ?6 Lat leg - Ankle 14    R Median, Ulnar - Transcarpal comparison     Median Palm Wrist 2.1 ?2.2 125 ?35 Median Palm - Wrist 8       Ulnar Palm Wrist 2.0 ?2.2 43 ?12 Ulnar Palm - Wrist 8          Median Palm - Ulnar Palm  0.1 ?0.4  R Median - Orthodromic (Dig II, Mid palm)     Dig II Wrist 3.1 ?3.4 29 ?10 Dig II - Wrist 13    R Ulnar - Orthodromic, (Dig V, Mid palm)     Dig V Wrist 2.6 ?3.1 16 ?5 Dig V - Wrist 11  F  Wave    Nerve F Lat Ref.   ms ms  R Tibial - AH 42.4 ?56.0  R Ulnar - ADM 25.6 ?32.0         EMG Summary Table    Spontaneous MUAP Recruitment  Muscle IA Fib PSW Fasc Other Amp Dur. Poly Pattern  R. Deltoid Normal None None None _______ Normal Normal Normal Normal  R. Triceps brachii Normal None None None _______ Normal Normal Normal Normal  R. Pronator teres Normal None None None _______ Normal Normal Normal Normal  R. First dorsal interosseous Normal None None None _______ Normal Normal Normal Normal  R. Opponens pollicis Normal None None None _______ Normal Normal Normal Normal  R. Vastus medialis Normal None None None _______ Normal Normal Normal Normal  R. Tibialis anterior Normal None None None _______ Normal Normal Normal Normal  R. Gluteus medius Normal None None None _______ Normal Normal Normal Normal  R. Gluteus maximus Normal None None None _______ Normal Normal Normal Normal  R. Gastrocnemius (Medial head) Normal None None None _______ Normal Normal Normal Normal  R. Biceps femoris (long head) Normal None None None _______ Normal Normal Normal Normal  R. Cervical paraspinals (low) Normal None None None _______ Normal Normal Normal Normal  R. Lumbar paraspinals (low) Normal None None None _______ Normal Normal Normal Normal

## 2020-04-24 LAB — PAN-ANCA
ANCA Proteinase 3: <3.5 U/mL (ref 0.0–3.5)
Atypical pANCA: <1:20 {titer}
C-ANCA: <1:20 {titer}
Myeloperoxidase Ab: <9 U/mL (ref 0.0–9.0)
P-ANCA: <1:20 {titer}

## 2020-04-24 LAB — MULTIPLE MYELOMA PANEL, SERUM
Albumin SerPl Elph-Mcnc: 4.4 g/dL (ref 2.9–4.4)
Albumin/Glob SerPl: 1.5 (ref 0.7–1.7)
Alpha 1: 0.3 g/dL (ref 0.0–0.4)
Alpha2 Glob SerPl Elph-Mcnc: 0.6 g/dL (ref 0.4–1.0)
B-Globulin SerPl Elph-Mcnc: 1.1 g/dL (ref 0.7–1.3)
Gamma Glob SerPl Elph-Mcnc: 1 g/dL (ref 0.4–1.8)
Globulin, Total: 3 g/dL (ref 2.2–3.9)
IgA/Immunoglobulin A, Serum: 150 mg/dL (ref 87–352)
IgG (Immunoglobin G), Serum: 1066 mg/dL (ref 586–1602)
IgM (Immunoglobulin M), Srm: 80 mg/dL (ref 26–217)
Total Protein: 7.4 g/dL (ref 6.0–8.5)

## 2020-04-24 LAB — HEAVY METALS, BLOOD
Arsenic: 1 ug/L (ref 0–9)
Lead, Blood: <1 ug/dL (ref 0–4)
Mercury: 1.1 ug/L (ref 0.0–14.9)

## 2020-04-24 LAB — VITAMIN B1: Thiamine: 77.6 nmol/L (ref 66.5–200.0)

## 2020-04-24 LAB — RHEUMATOID FACTOR: Rheumatoid fact SerPl-aCnc: <10 IU/mL (ref <14.0)

## 2020-04-24 LAB — B12 AND FOLATE PANEL
Folate: 16.9 ng/mL (ref >3.0)
Vitamin B-12: 366 pg/mL (ref 232–1245)

## 2020-04-24 LAB — SEDIMENTATION RATE: Sed Rate: 2 mm/hr (ref 0–32)

## 2020-04-24 LAB — RPR: RPR Ser Ql: NONREACTIVE

## 2020-04-24 LAB — SJOGREN'S SYNDROME ANTIBODS(SSA + SSB)
ENA SSA (RO) Ab: 0.2 AI (ref 0.0–0.9)
ENA SSB (LA) Ab: <0.2 AI (ref 0.0–0.9)

## 2020-04-24 LAB — HIV ANTIBODY (ROUTINE TESTING W REFLEX): HIV Screen 4th Generation wRfx: NONREACTIVE

## 2020-04-24 LAB — COPPER, SERUM: Copper: 96 ug/dL (ref 80–158)

## 2020-04-24 LAB — ANTINUCLEAR ANTIBODIES, IFA: ANA Titer 1: NEGATIVE

## 2020-04-24 LAB — METHYLMALONIC ACID, SERUM: Methylmalonic Acid: 124 nmol/L (ref 0–378)

## 2020-04-24 LAB — TSH: TSH: 2.49 u[IU]/mL (ref 0.450–4.500)

## 2020-04-24 LAB — VITAMIN B6: Vitamin B6: 5.9 ug/L (ref 2.0–32.8)

## 2020-04-24 LAB — HEMOGLOBIN A1C
Est. average glucose Bld gHb Est-mCnc: 100 mg/dL
Hgb A1c MFr Bld: 5.1 % (ref 4.8–5.6)

## 2020-04-24 LAB — ANGIOTENSIN CONVERTING ENZYME: Angio Convert Enzyme: 32 U/L (ref 14–82)

## 2020-04-24 LAB — HEPATITIS C ANTIBODY: Hep C Virus Ab: <0.1 s/co ratio (ref 0.0–0.9)

## 2021-07-04 ENCOUNTER — Other Ambulatory Visit: Payer: Self-pay | Admitting: Orthopaedic Surgery

## 2021-07-04 DIAGNOSIS — M5416 Radiculopathy, lumbar region: Secondary | ICD-10-CM

## 2021-07-30 ENCOUNTER — Encounter (HOSPITAL_BASED_OUTPATIENT_CLINIC_OR_DEPARTMENT_OTHER): Payer: Self-pay

## 2021-07-30 ENCOUNTER — Emergency Department (HOSPITAL_BASED_OUTPATIENT_CLINIC_OR_DEPARTMENT_OTHER)
Admission: EM | Admit: 2021-07-30 | Discharge: 2021-07-30 | Disposition: A | Discharge status: Home / Self Care | Payer: BC Managed Care – PPO | Attending: Emergency Medicine | Admitting: Emergency Medicine

## 2021-07-30 ENCOUNTER — Other Ambulatory Visit: Payer: Self-pay

## 2021-07-30 DIAGNOSIS — K5903 Drug induced constipation: Secondary | ICD-10-CM

## 2021-07-30 DIAGNOSIS — R1084 Generalized abdominal pain: Secondary | ICD-10-CM | POA: Diagnosis present

## 2021-07-30 DIAGNOSIS — J45909 Unspecified asthma, uncomplicated: Secondary | ICD-10-CM | POA: Diagnosis not present

## 2021-07-30 DIAGNOSIS — K5641 Fecal impaction: Secondary | ICD-10-CM | POA: Insufficient documentation

## 2021-07-30 LAB — PREGNANCY, URINE: Preg Test, Ur: NEGATIVE

## 2021-07-30 LAB — COMPREHENSIVE METABOLIC PANEL
ALT: 15 U/L (ref 0–44)
AST: 19 U/L (ref 15–41)
Albumin: 4.7 g/dL (ref 3.5–5.0)
Alkaline Phosphatase: 37 U/L — ABNORMAL LOW (ref 38–126)
Anion gap: 10 (ref 5–15)
BUN: 10 mg/dL (ref 6–20)
CO2: 26 mmol/L (ref 22–32)
Calcium: 9.5 mg/dL (ref 8.9–10.3)
Chloride: 102 mmol/L (ref 98–111)
Creatinine, Ser: 0.5 mg/dL (ref 0.44–1.00)
GFR, Estimated: >60 mL/min (ref >60)
Glucose, Bld: 85 mg/dL (ref 70–99)
Potassium: 3.6 mmol/L (ref 3.5–5.1)
Sodium: 138 mmol/L (ref 135–145)
Total Bilirubin: 0.7 mg/dL (ref 0.3–1.2)
Total Protein: 7.2 g/dL (ref 6.5–8.1)

## 2021-07-30 LAB — URINALYSIS, ROUTINE W REFLEX MICROSCOPIC
Bilirubin Urine: NEGATIVE
Glucose, UA: NEGATIVE mg/dL
Hgb urine dipstick: NEGATIVE
Ketones, ur: NEGATIVE mg/dL
Nitrite: NEGATIVE
Protein, ur: NEGATIVE mg/dL
Specific Gravity, Urine: 1.006 (ref 1.005–1.030)
pH: 7.5 (ref 5.0–8.0)

## 2021-07-30 LAB — CBC
HCT: 39.4 % (ref 36.0–46.0)
Hemoglobin: 13.7 g/dL (ref 12.0–15.0)
MCH: 31.9 pg (ref 26.0–34.0)
MCHC: 34.8 g/dL (ref 30.0–36.0)
MCV: 91.8 fL (ref 80.0–100.0)
Platelets: 206 10*3/uL (ref 150–400)
RBC: 4.29 MIL/uL (ref 3.87–5.11)
RDW: 11.9 % (ref 11.5–15.5)
WBC: 8.1 10*3/uL (ref 4.0–10.5)
nRBC: 0 % (ref 0.0–0.2)

## 2021-07-30 LAB — OCCULT BLOOD X 1 CARD TO LAB, STOOL: Fecal Occult Bld: NEGATIVE

## 2021-07-30 LAB — LIPASE, BLOOD: Lipase: <10 U/L — ABNORMAL LOW (ref 11–51)

## 2021-07-30 MED ORDER — ONDANSETRON 4 MG PO TBDP
4.0000 mg | ORAL_TABLET | Freq: Once | ORAL | Status: AC | PRN
  Administered 2021-07-30: 4 mg via ORAL

## 2021-07-30 MED ORDER — ONDANSETRON 4 MG PO TBDP: 4.0000 mg | ORAL_TABLET | Freq: Three times a day (TID) | ORAL | 0 refills | Status: AC | PRN

## 2021-07-30 MED ORDER — FLEET ENEMA 7-19 GM/118ML RE ENEM
1.0000 | ENEMA | Freq: Once | RECTAL | Status: AC
  Administered 2021-07-30: 1 via RECTAL
  Filled 2021-07-30: qty 1

## 2021-07-30 MED ORDER — ONDANSETRON 4 MG PO TBDP
ORAL_TABLET | ORAL | Status: AC
  Filled 2021-07-30: qty 1

## 2021-07-30 NOTE — ED Notes (Signed)
At bedside with PA, Theron Arista, for patient exam as a chaperone.

## 2021-07-30 NOTE — Discharge Instructions (Addendum)
You came to the emergency department today to be evaluated for your constipation, nausea, and vomiting.  Your physical exam showed that you had a fecal impaction which was manually disimpacted in the emergency department.  Your constipation and fecal impaction may be secondary to your buprenorphine use.  Please take MiraLAX as we discussed and follow-up with your pain management clinic for further management.  Get help right away if: You have a fever and your symptoms suddenly get worse. You leak stool or have blood in your stool. Your abdomen is bloated. You have severe pain in your abdomen. You feel dizzy or you faint.

## 2021-07-30 NOTE — ED Triage Notes (Signed)
Pt reports abdomina pain, nausea, vomiting and constipation. States her last bowel movement was Thursday. Reports she has also had blood in her stool for the past 3 days, not sure if it is from all the straining to try to have a bm. Pt states she was seen at the ED yesterday at [facility] for the same.

## 2021-07-30 NOTE — ED Provider Notes (Signed)
[facility] EMERGENCY DEPT Provider Note   CSN: [id number] Arrival date & time: 07/30/21  1216     History  Chief Complaint  Patient presents with   Abdominal Pain   Constipation   Rectal Bleeding   Emesis    Terry Schneider is a 31 y.o. female with a past medical history of asthma, generalized anxiety reports disorder, depression, anorexia nervosa in remission, chronic pain syndrome.  Presents to the emergency department with a chief complaint of constipation, rectal bleeding, nausea, vomiting and abdominal discomfort.  Patient reports that he started having decreased bowel movements starting last Tuesday.  Patient has not had a bowel movement since Thursday.  Patient states he to small bathroom at that time.  Patient reports that she has been using MiraLAX daily since last week, suppositories of the last 1 to 2 days and enema yesterday with no improvement in her constipation.  Patient reports that she has started having middle lateral of liquid stool over the last 1 to 2 days.  Patient endorses generalized abdominal discomfort  Patient states that he started having nausea vomiting yesterday after returning from [facility] emergency department.  Patient has vomited 7 times in the last 24 hours.  Reports emesis has looked like stomach contents and bilious.  Patient also reports minimal amount of rectal bleeding.  Describes blood as bright red in color.  Patient does not have any history of abdominal surgeries or blood thinner use.  Patient endorses daily marijuana use for her chronic pain and social drinking.  Patient does not have regular menstrual periods due to IUD in place.   Abdominal Pain Associated symptoms: chills, constipation, hematochezia, nausea and vomiting   Associated symptoms: no chest pain, no diarrhea, no dysuria, no fever, no shortness of breath, no vaginal bleeding and no vaginal discharge   Constipation Associated symptoms: abdominal pain,  hematochezia, nausea and vomiting   Associated symptoms: no back pain, no diarrhea, no dysuria and no fever   Rectal Bleeding Associated symptoms: abdominal pain and vomiting   Associated symptoms: no dizziness, no fever and no light-headedness   Emesis Associated symptoms: abdominal pain and chills   Associated symptoms: no diarrhea, no fever and no headaches        Home Medications Prior to Admission medications   Medication Sig Start Date End Date Taking? Authorizing Provider  ALPRAZolam Prudy Feeler) 0.5 MG tablet Take 0.5 mg by mouth at bedtime as needed. 02/28/20   [provider]  gabapentin (NEURONTIN) 600 MG tablet Take 300-600 mg by mouth 3 (three) times daily. 01/28/20   [provider]  levonorgestrel (MIRENA, 52 MG,) 20 MCG/24HR IUD Mirena 20 mcg/24 hours (5 yrs) 52 mg intrauterine device  Take 1 device every day by intrauterine route.    [provider]  Multiple Vitamin (MULTIVITAMIN WITH MINERALS) TABS tablet Take 1 tablet by mouth daily.    [provider]  SUMAtriptan (IMITREX) 100 MG tablet Take 100 mg by mouth as directed. 03/04/20   [provider]  Tapentadol HCl (NUCYNTA) 100 MG TABS Take 1 tablet (100 mg total) by mouth every 6 (six) hours as needed. 11/10/19   Ardith Dark, MD  traZODone (DESYREL) 50 MG tablet Take 50 mg by mouth at bedtime as needed. 02/28/20   [provider]      Allergies    Prednisone and Rexulti [brexpiprazole]    Review of Systems   Review of Systems  Constitutional:  Positive for chills. Negative for fever.  Eyes:  Negative for visual disturbance.  Respiratory:  Negative for shortness of breath.   Cardiovascular:  Negative for chest pain.  Gastrointestinal:  Positive for abdominal pain, constipation, hematochezia, nausea and vomiting. Negative for abdominal distention, anal bleeding, blood in stool, diarrhea and rectal pain.  Genitourinary:  Positive for frequency. Negative for difficulty  urinating, dysuria, flank pain, vaginal bleeding, vaginal discharge and vaginal pain.  Musculoskeletal:  Negative for back pain and neck pain.  Skin:  Negative for color change and rash.  Neurological:  Negative for dizziness, syncope, light-headedness and headaches.  Psychiatric/Behavioral:  Negative for confusion.     Physical Exam Updated Vital Signs BP (!) 115/96   Pulse 85   Temp 98.1 F (36.7 C)   Resp 18   Ht 5\' 5"  (1.651 m)   Wt 59.4 kg   SpO2 99%   BMI 21.80 kg/m  Physical Exam Vitals and nursing note reviewed. Chaperone present: Female nurse tech present as Biomedical engineer.  Constitutional:      General: She is not in acute distress.    Appearance: She is not ill-appearing, toxic-appearing or diaphoretic.  HENT:     Head: Normocephalic.  Eyes:     General: No scleral icterus.       Right eye: No discharge.        Left eye: No discharge.  Cardiovascular:     Rate and Rhythm: Normal rate.  Pulmonary:     Effort: Pulmonary effort is normal.  Abdominal:     General: Abdomen is flat. Bowel sounds are normal. There is no distension. There are no signs of injury.     Palpations: Abdomen is soft. There is no mass.     Tenderness: There is abdominal tenderness. There is no right CVA tenderness, left CVA tenderness or guarding.     Hernia: There is no hernia in the umbilical area or ventral area.     Comments: Minimal generalized tenderness throughout abdomen  Genitourinary:    Rectum: Guaiac result negative. No mass, tenderness, anal fissure, external hemorrhoid or internal hemorrhoid. Normal anal tone.     Comments: Dark brown stool noted within rectal vault.  No frank red blood or melena.  Fecal impaction noted within rectal vault Skin:    General: Skin is warm and dry.  Neurological:     General: No focal deficit present.     Mental Status: She is alert.  Psychiatric:        Behavior: Behavior is cooperative.     ED Results / Procedures / Treatments   Labs (all  labs ordered are listed, but only abnormal results are displayed) Labs Reviewed  LIPASE, BLOOD - Abnormal; Notable for the following components:      Result Value   Lipase <10 (*)    All other components within normal limits  COMPREHENSIVE METABOLIC PANEL - Abnormal; Notable for the following components:   Alkaline Phosphatase 37 (*)    All other components within normal limits  URINALYSIS, ROUTINE W REFLEX MICROSCOPIC - Abnormal; Notable for the following components:   APPearance HAZY (*)    Leukocytes,Ua TRACE (*)    Bacteria, UA RARE (*)    All other components within normal limits  CBC  PREGNANCY, URINE  OCCULT BLOOD X 1 CARD TO LAB, STOOL    EKG None  Radiology No results found.  Procedures Fecal disimpaction  Date/Time: 07/30/2021 3:11 PM  Performed by: Haskel Schroeder, PA-C Authorized by: Haskel Schroeder, PA-C  Consent: Verbal consent obtained. Risks  and benefits: risks, benefits and alternatives were discussed Consent given by: patient Patient understanding: patient states understanding of the procedure being performed Patient consent: the patient's understanding of the procedure matches consent given Procedure consent: procedure consent matches procedure scheduled Required items: required blood products, implants, devices, and special equipment available Patient identity confirmed: verbally with patient and arm band Time out: Immediately prior to procedure a "time out" was called to verify the correct patient, procedure, equipment, support staff and site/side marked as required. Local anesthesia used: no  Anesthesia: Local anesthesia used: no  Sedation: Patient sedated: no  Patient tolerance: patient tolerated the procedure well with no immediate complications       Medications Ordered in ED Medications  ondansetron (ZOFRAN-ODT) disintegrating tablet 4 mg (4 mg Oral Given 07/30/21 1248)  sodium phosphate (FLEET) 7-19 GM/118ML enema 1 enema (1  enema Rectal Given 07/30/21 1604)    ED Course/ Medical Decision Making/ A&P                           Medical Decision Making Amount and/or Complexity of Data Reviewed Labs: ordered.  Risk OTC drugs. Prescription drug management.   Alert 31 year old female in no acute distress, nontoxic-appearing.    Per chart review patient was seen at [facility] emergency department yesterday for similar symptoms.  Had abdominal x-ray performed yesterday which showed mild fecal retention with stool visible within the rectum, left colon, right colon.  No bowel obstruction or gross free air.  Patient was diagnosed with constipation and prescribed magnesium citrate.  Patient has no history of abdominal surgeries therefore low suspicion for small bowel obstruction.  Patient reports that she does not take any regular stool softeners with her buprenorphine.  Suspect that patient is having constipation secondary to opiate medication.  Will obtain basic lab work and Hemoccult testing.  During rectal exam patient was noted to have fecal impaction.  This was manually disimpacted.  Patient reports immediate improvement in her abdominal discomfort after this was done.  Patient is requesting a enema to be performed as well  I personally viewed interpret patient's lab results.  Pertinent findings include: -Hemoccult negative -Urinalysis shows bacteria rare, WBC 16, leukocyte trace, nitrite negative; with rare bacteria low suspicion for UTI at this time -CBC and lipase unremarkable -No large electrolyte abnormality  On serial reexamination patient has resolution of abdominal tenderness.  Abdomen remains soft, nondistended, and nontender.  Patient hemodynamically stable.  Patient reports that she has MiraLAX at home and will continue to take that on a daily basis.  Patient to follow-up with her pain management clinic for further management of opiate-induced constipation.  Patient to follow-up with PCP  as needed.  I gave patient prescription of Zofran to take as needed for nausea and vomiting.  Based on patient's chief complaint, I considered admission might be necessary, however after reassuring ED workup feel patient is reasonable for discharge.  Discussed results, findings, treatment and follow up. Patient advised of return precautions. Patient verbalized understanding and agreed with plan.  Portions of this note were generated with Scientist, clinical (histocompatibility and immunogenetics). Dictation errors may occur despite best attempts at proofreading.         Final Clinical Impression(s) / ED Diagnoses Final diagnoses:  Fecal impaction (HCC)  Drug-induced constipation    Rx / DC Orders ED Discharge Orders          Ordered    ondansetron (ZOFRAN-ODT) 4 MG disintegrating tablet  Every 8 hours PRN        07/30/21 1625              Berneice Heinrich 07/30/21 1630    Tegeler, Canary Brim, MD 07/31/21 [phone]

## 2021-07-30 NOTE — ED Notes (Signed)
Discharge instructions, follow up care, and prescription reviewed and explained, pt verbalized understanding. Pt reports pain relief and was caox4 and ambulatory on departure.
